# Patient Record
Sex: Female | Born: 1980 | Race: White | Hispanic: Yes | Marital: Single | Smoking: Never smoker
Health system: Southern US, Community
[De-identification: ages and names within clinical notes are randomized; demographics above are authoritative.]

## PROBLEM LIST (undated history)

## (undated) DIAGNOSIS — K219 Gastro-esophageal reflux disease without esophagitis: Secondary | ICD-10-CM

## (undated) DIAGNOSIS — F32A Depression, unspecified: Secondary | ICD-10-CM

## (undated) DIAGNOSIS — N390 Urinary tract infection, site not specified: Secondary | ICD-10-CM

## (undated) DIAGNOSIS — Z8619 Personal history of other infectious and parasitic diseases: Secondary | ICD-10-CM

## (undated) DIAGNOSIS — F329 Major depressive disorder, single episode, unspecified: Secondary | ICD-10-CM

## (undated) HISTORY — DX: Urinary tract infection, site not specified: N39.0

## (undated) HISTORY — DX: Depression, unspecified: F32.A

## (undated) HISTORY — DX: Gastro-esophageal reflux disease without esophagitis: K21.9

## (undated) HISTORY — DX: Major depressive disorder, single episode, unspecified: F32.9

## (undated) HISTORY — DX: Personal history of other infectious and parasitic diseases: Z86.19

---

## 2004-03-25 ENCOUNTER — Inpatient Hospital Stay (HOSPITAL_COMMUNITY): Admission: AD | Admit: 2004-03-25 | Discharge: 2004-03-25 | Payer: Self-pay | Admitting: Obstetrics & Gynecology

## 2004-05-05 ENCOUNTER — Ambulatory Visit (HOSPITAL_COMMUNITY): Admission: RE | Admit: 2004-05-05 | Discharge: 2004-05-05 | Payer: Self-pay | Admitting: *Deleted

## 2004-09-21 ENCOUNTER — Inpatient Hospital Stay (HOSPITAL_COMMUNITY): Admission: AD | Admit: 2004-09-21 | Discharge: 2004-09-21 | Payer: Self-pay | Admitting: *Deleted

## 2004-09-22 ENCOUNTER — Ambulatory Visit: Payer: Self-pay | Admitting: Obstetrics & Gynecology

## 2004-09-22 ENCOUNTER — Inpatient Hospital Stay (HOSPITAL_COMMUNITY): Admission: AD | Admit: 2004-09-22 | Discharge: 2004-09-24 | Payer: Self-pay | Admitting: Obstetrics & Gynecology

## 2005-03-24 IMAGING — US US OB COMP +14 WK
1 series · 13 of 28 positions shown · non-contrast
Comparison: none

CLINICAL DATA: Anatomic exam.  Placental location.

[Series 1: us ob comp +14 wk · 0.27mm/px · 13 of 94 slices shown]
[im 4/94]
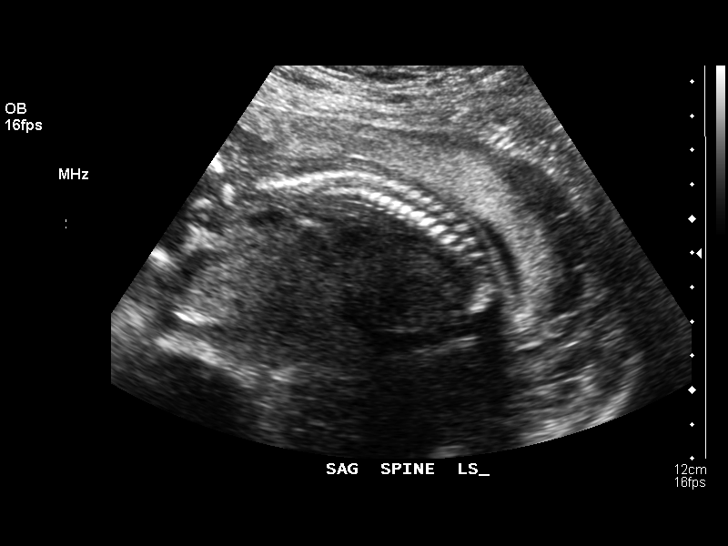
[im 11/94]
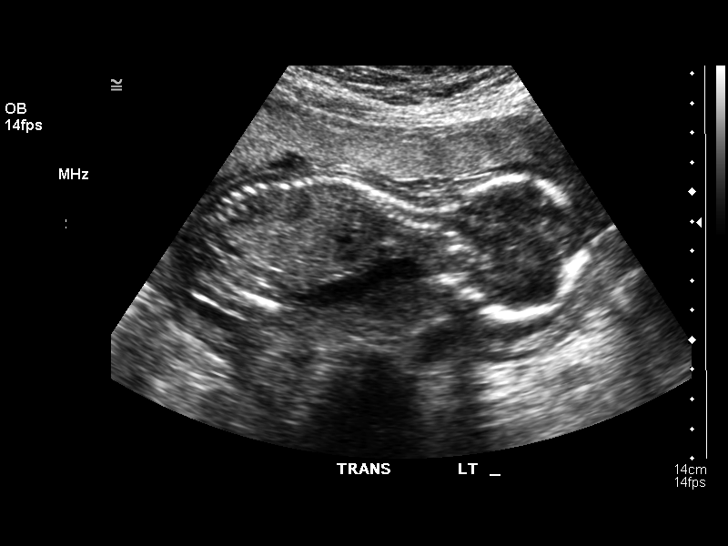
[im 18/94]
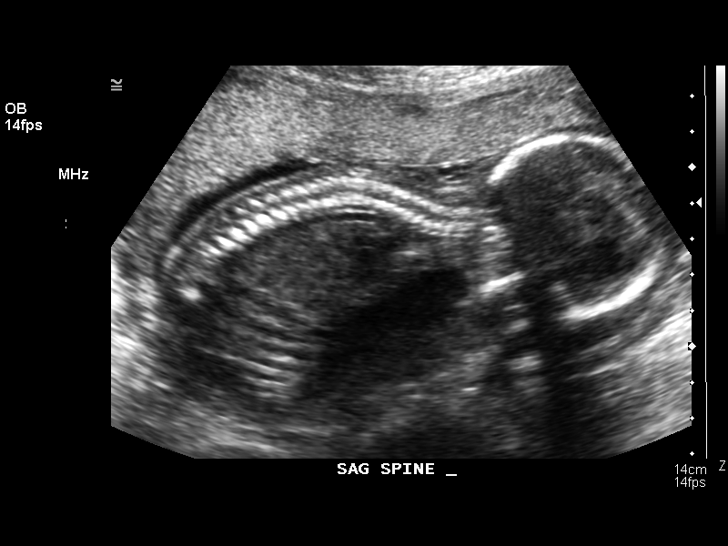
[im 25/94]
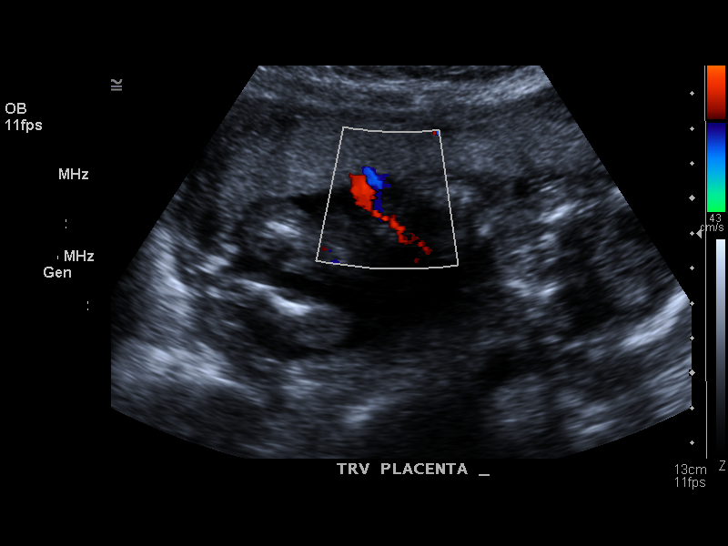
[im 32/94]
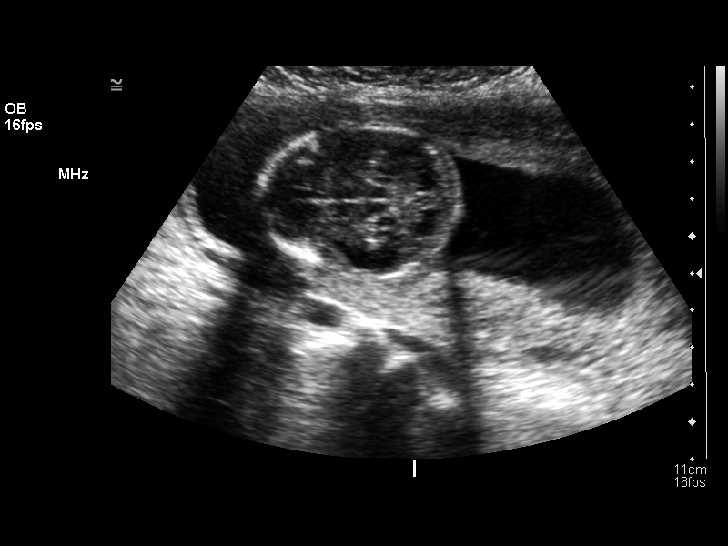
[im 38/94]
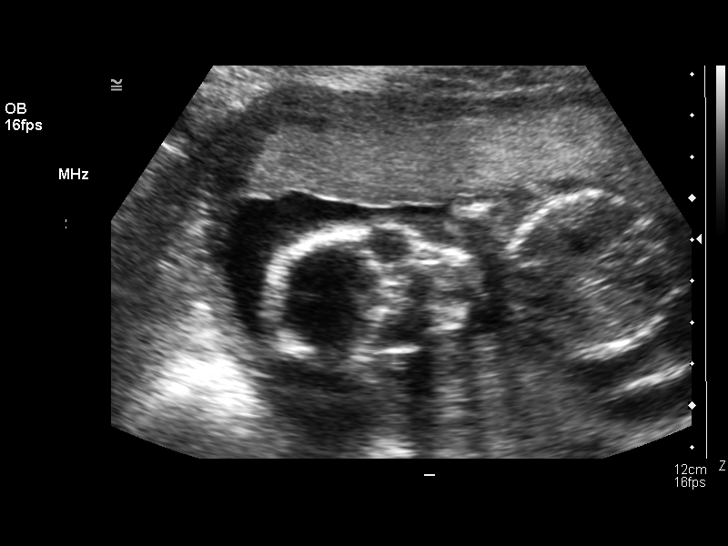
[im 49/94]
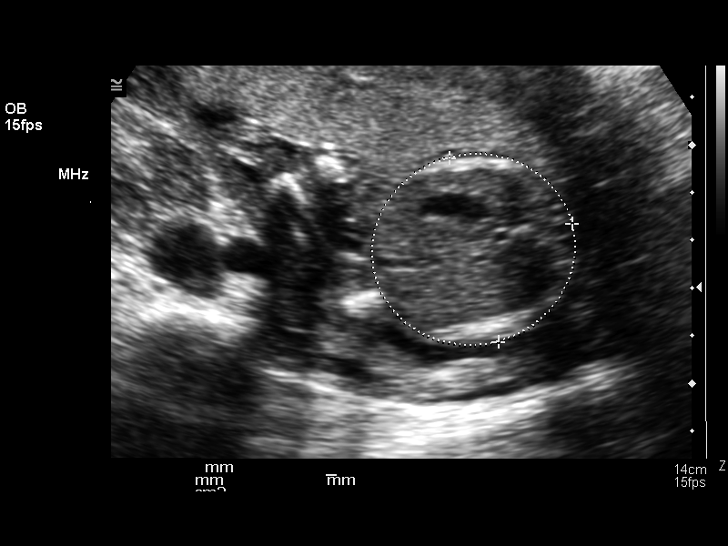
[im 56/94]
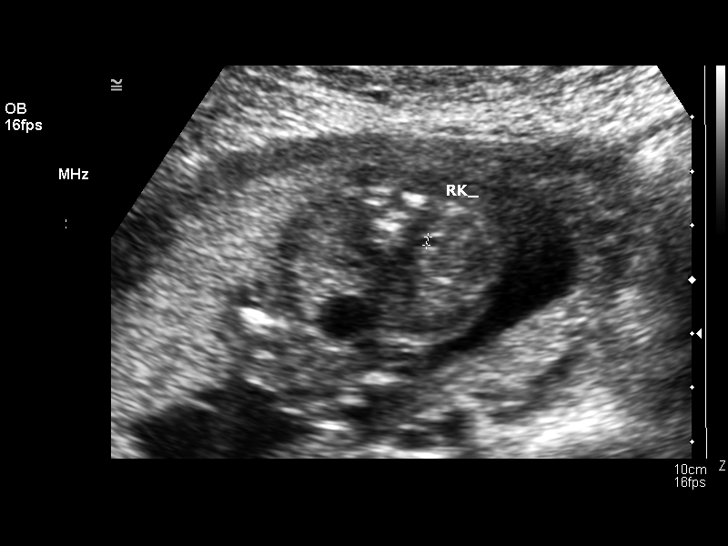
[im 63/94]
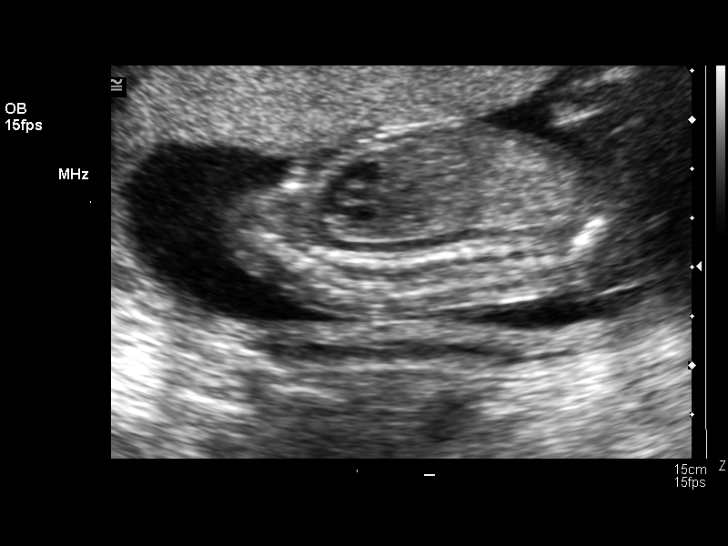
[im 69/94]
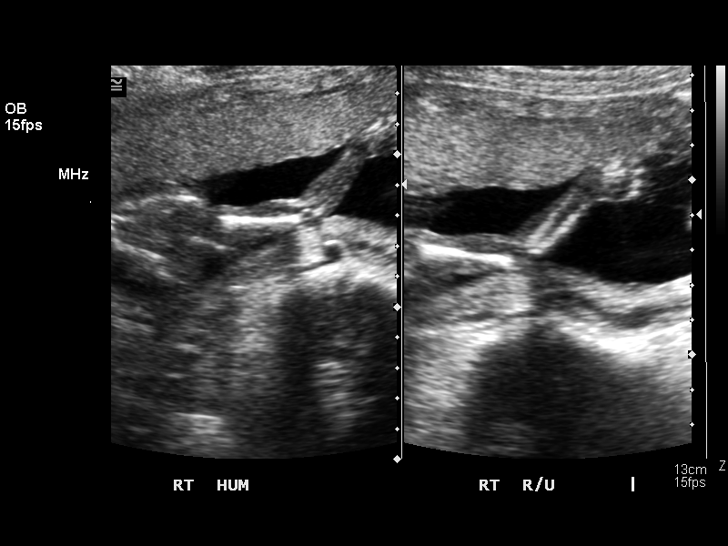
[im 76/94]
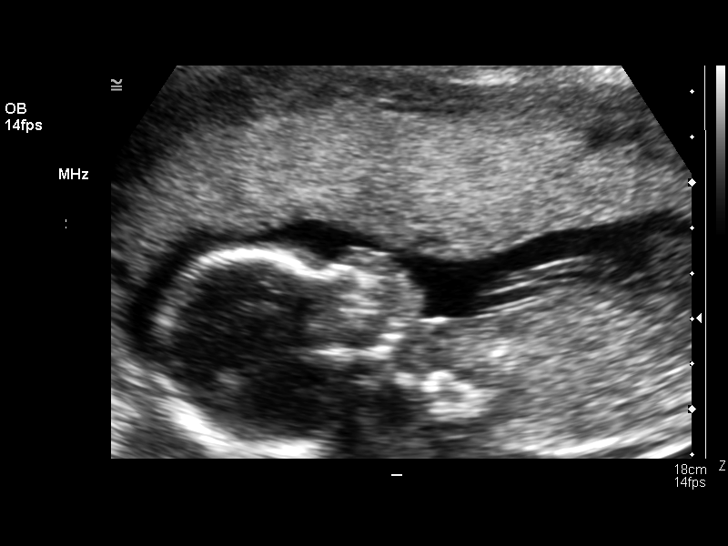
[im 83/94]
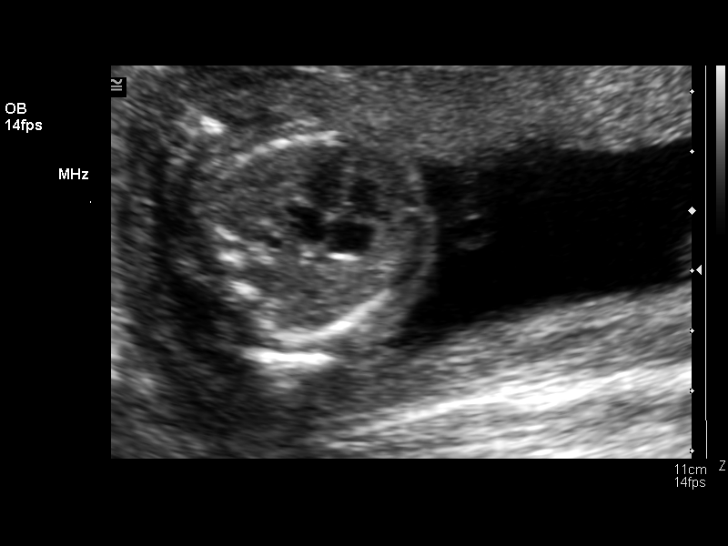
[im 90/94]
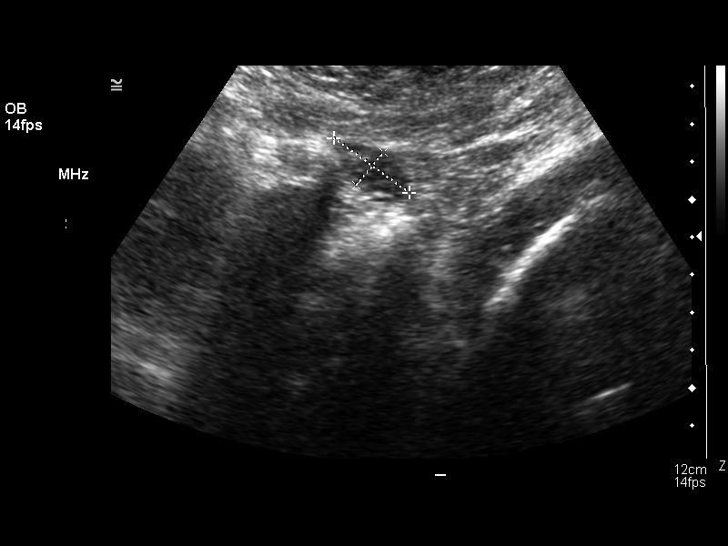

[13 of 28 positions shown; findings below may reference images not displayed]

OBSTETRICAL ULTRASOUND:
Number of Fetuses: 1
Heart Rate: 133 bpm
Movement: yes
Breathing: no       
Presentation: transverse with head to maternal left
Placental Location: anterior
Grade: I
Previa: no
Amniotic Fluid (Subjective): normal
Amniotic Fluid (Objective):   3.3 cm vertical pocket

FETAL BIOMETRY
BPD:  4.2 cm ? 18 w 5 d 
HC: 17.0 cm ? 19 w 2 d  
AC: 13.0 cm ? 18 w 4 d  
FL: 2.8 cm ? 18 w 3 d

MEAN GA:   19 w 2 d

FETAL ANATOMY
Lateral Ventricles: visualized   
Thalami/CSP: visualized     
Posterior Fossa: visualized       
Nuchal Region: N/A  
Spine: visualized     
4 Chamber Heart on Left:  visualized 
Stomach on Left: visualized     
3 Vessel Cord: visualized   
Cord Insertion site: visualized   
Kidneys: visualized   
Bladder: visualized   
Extremities: visualized     

ADDITIONAL ANATOMY VISUALIZED:  LVOT, RVOT, upper lip, orbits, profile, diaphragm, heel, 5th digit, ductal arch, and aortic arch.

MATERNAL UTERINE AND ADNEXAL FINDINGS
Cervix: 4.0 cm transabdominally.
IMPRESSION: 1.  Single intrauterine pregnancy demonstrating an estimated gestational age by ultrasound of 18 weeks 3 days.  Correlation with assigned gestational age of 19 weeks 2 days suggests appropriate growth.
2.  No focal fetal or placental abnormalities are noted with a good anatomic exam possible.
3.  Normal ovaries. 

</u12:p>

## 2005-11-09 ENCOUNTER — Inpatient Hospital Stay (HOSPITAL_COMMUNITY): Admission: AD | Admit: 2005-11-09 | Discharge: 2005-11-09 | Payer: Self-pay | Admitting: Family Medicine

## 2013-03-17 ENCOUNTER — Ambulatory Visit: Payer: Self-pay

## 2013-04-22 ENCOUNTER — Ambulatory Visit: Payer: Self-pay

## 2013-04-22 ENCOUNTER — Ambulatory Visit: Payer: Self-pay | Admitting: Internal Medicine

## 2013-09-22 ENCOUNTER — Other Ambulatory Visit (HOSPITAL_COMMUNITY): Payer: Self-pay | Admitting: Physician Assistant

## 2013-09-22 DIAGNOSIS — Z3689 Encounter for other specified antenatal screening: Secondary | ICD-10-CM

## 2013-09-22 LAB — OB RESULTS CONSOLE ABO/RH: RH Type: POSITIVE

## 2013-09-22 LAB — OB RESULTS CONSOLE HEPATITIS B SURFACE ANTIGEN: Hepatitis B Surface Ag: NEGATIVE

## 2013-09-22 LAB — OB RESULTS CONSOLE RUBELLA ANTIBODY, IGM: Rubella: IMMUNE

## 2013-09-22 LAB — OB RESULTS CONSOLE GC/CHLAMYDIA
Chlamydia: NEGATIVE
Gonorrhea: NEGATIVE

## 2013-09-22 LAB — OB RESULTS CONSOLE HIV ANTIBODY (ROUTINE TESTING): HIV: NONREACTIVE

## 2013-09-22 LAB — OB RESULTS CONSOLE RPR: RPR: NONREACTIVE

## 2013-09-22 LAB — OB RESULTS CONSOLE ANTIBODY SCREEN: Antibody Screen: NEGATIVE

## 2013-09-29 ENCOUNTER — Ambulatory Visit (HOSPITAL_COMMUNITY)
Admission: RE | Admit: 2013-09-29 | Discharge: 2013-09-29 | Disposition: A | Discharge status: Home / Self Care | Payer: Medicaid Other | Source: Ambulatory Visit | Attending: Physician Assistant | Admitting: Physician Assistant

## 2013-09-29 DIAGNOSIS — Z3689 Encounter for other specified antenatal screening: Secondary | ICD-10-CM | POA: Insufficient documentation

## 2013-10-20 ENCOUNTER — Other Ambulatory Visit (HOSPITAL_COMMUNITY): Payer: Self-pay | Admitting: Physician Assistant

## 2013-10-20 DIAGNOSIS — Z1389 Encounter for screening for other disorder: Secondary | ICD-10-CM

## 2013-10-27 ENCOUNTER — Ambulatory Visit (HOSPITAL_COMMUNITY)
Admission: RE | Admit: 2013-10-27 | Discharge: 2013-10-27 | Disposition: A | Discharge status: Home / Self Care | Payer: Self-pay | Source: Ambulatory Visit | Attending: Physician Assistant | Admitting: Physician Assistant

## 2013-10-27 DIAGNOSIS — Z1389 Encounter for screening for other disorder: Secondary | ICD-10-CM | POA: Insufficient documentation

## 2013-10-27 DIAGNOSIS — Z363 Encounter for antenatal screening for malformations: Secondary | ICD-10-CM | POA: Insufficient documentation

## 2014-02-09 LAB — OB RESULTS CONSOLE GBS: GBS: NEGATIVE

## 2014-02-09 LAB — OB RESULTS CONSOLE GC/CHLAMYDIA
Chlamydia: NEGATIVE
Gonorrhea: NEGATIVE

## 2014-03-09 ENCOUNTER — Other Ambulatory Visit (HOSPITAL_COMMUNITY): Payer: Self-pay | Admitting: Urology

## 2014-03-09 DIAGNOSIS — O480 Post-term pregnancy: Secondary | ICD-10-CM

## 2014-03-12 ENCOUNTER — Ambulatory Visit (HOSPITAL_COMMUNITY): Admission: RE | Admit: 2014-03-12 | Payer: Self-pay | Source: Ambulatory Visit

## 2014-03-12 ENCOUNTER — Encounter (HOSPITAL_COMMUNITY): Payer: Self-pay | Admitting: *Deleted

## 2014-03-12 ENCOUNTER — Telehealth (HOSPITAL_COMMUNITY): Payer: Self-pay | Admitting: *Deleted

## 2014-03-12 NOTE — Telephone Encounter (Signed)
Preadmission screen Interpreter number [phone]

## 2014-03-12 NOTE — Telephone Encounter (Signed)
Pt states she missed an ultrasound appt this morning to assess baby placenta and fluid level this morning.  States she contacted Health dept to tell them US not done.  No ucs, leaking of fluid or bleeding,  Moving well.follow up from previous call

## 2014-03-16 ENCOUNTER — Inpatient Hospital Stay (HOSPITAL_COMMUNITY)
Admission: RE | Admit: 2014-03-16 | Discharge: 2014-03-20 | DRG: 766 | Disposition: A | Discharge status: Home / Self Care | Payer: Medicaid Other | Source: Ambulatory Visit | Attending: Family Medicine | Admitting: Family Medicine

## 2014-03-16 ENCOUNTER — Encounter (HOSPITAL_COMMUNITY): Payer: Self-pay

## 2014-03-16 DIAGNOSIS — O328XX0 Maternal care for other malpresentation of fetus, not applicable or unspecified: Secondary | ICD-10-CM | POA: Diagnosis present

## 2014-03-16 DIAGNOSIS — O480 Post-term pregnancy: Secondary | ICD-10-CM | POA: Diagnosis present

## 2014-03-16 DIAGNOSIS — Z833 Family history of diabetes mellitus: Secondary | ICD-10-CM | POA: Diagnosis not present

## 2014-03-16 DIAGNOSIS — Z8249 Family history of ischemic heart disease and other diseases of the circulatory system: Secondary | ICD-10-CM | POA: Diagnosis not present

## 2014-03-16 DIAGNOSIS — Z3A41 41 weeks gestation of pregnancy: Secondary | ICD-10-CM | POA: Diagnosis present

## 2014-03-16 LAB — CBC
HCT: 38 % (ref 36.0–46.0)
Hemoglobin: 13.4 g/dL (ref 12.0–15.0)
MCH: 30.7 pg (ref 26.0–34.0)
MCHC: 35.3 g/dL (ref 30.0–36.0)
MCV: 87 fL (ref 78.0–100.0)
PLATELETS: 197 10*3/uL (ref 150–400)
RBC: 4.37 MIL/uL (ref 3.87–5.11)
RDW: 14.3 % (ref 11.5–15.5)
WBC: 9.2 10*3/uL (ref 4.0–10.5)

## 2014-03-16 MED ORDER — FLEET ENEMA 7-19 GM/118ML RE ENEM
1.0000 | ENEMA | RECTAL | Status: DC | PRN
Start: 2014-03-16 — End: 2014-03-17

## 2014-03-16 MED ORDER — ONDANSETRON HCL 4 MG/2ML IJ SOLN
4.0000 mg | Freq: Four times a day (QID) | INTRAMUSCULAR | Status: DC | PRN
  Administered 2014-03-17: 4 mg via INTRAVENOUS
  Filled 2014-03-16: qty 2

## 2014-03-16 MED ORDER — LIDOCAINE HCL (PF) 1 % IJ SOLN
30.0000 mL | INTRAMUSCULAR | Status: DC | PRN
  Filled 2014-03-16: qty 30

## 2014-03-16 MED ORDER — LACTATED RINGERS IV SOLN
500.0000 mL | INTRAVENOUS | Status: DC | PRN
  Administered 2014-03-17: 300 mL via INTRAVENOUS

## 2014-03-16 MED ORDER — OXYCODONE-ACETAMINOPHEN 5-325 MG PO TABS: 2.0000 | ORAL_TABLET | ORAL | Status: DC | PRN

## 2014-03-16 MED ORDER — OXYTOCIN BOLUS FROM INFUSION
500.0000 mL | INTRAVENOUS | Status: DC
Start: 2014-03-16 — End: 2014-03-17

## 2014-03-16 MED ORDER — CITRIC ACID-SODIUM CITRATE 334-500 MG/5ML PO SOLN
30.0000 mL | ORAL | Status: DC | PRN
  Administered 2014-03-17: 30 mL via ORAL
  Filled 2014-03-16: qty 15

## 2014-03-16 MED ORDER — OXYTOCIN 40 UNITS IN LACTATED RINGERS INFUSION - SIMPLE MED
1.0000 m[IU]/min | INTRAVENOUS | Status: DC
  Administered 2014-03-16: 2 m[IU]/min via INTRAVENOUS
  Filled 2014-03-16: qty 1000

## 2014-03-16 MED ORDER — LACTATED RINGERS IV SOLN
INTRAVENOUS | Status: DC
  Administered 2014-03-16 – 2014-03-17 (×8): via INTRAVENOUS

## 2014-03-16 MED ORDER — TERBUTALINE SULFATE 1 MG/ML IJ SOLN: 0.2500 mg | Freq: Once | INTRAMUSCULAR | Status: AC | PRN

## 2014-03-16 MED ORDER — OXYCODONE-ACETAMINOPHEN 5-325 MG PO TABS: 1.0000 | ORAL_TABLET | ORAL | Status: DC | PRN

## 2014-03-16 MED ORDER — ACETAMINOPHEN 325 MG PO TABS
650.0000 mg | ORAL_TABLET | ORAL | Status: DC | PRN
Start: 2014-03-16 — End: 2014-03-17

## 2014-03-16 MED ORDER — OXYTOCIN 40 UNITS IN LACTATED RINGERS INFUSION - SIMPLE MED: 62.5000 mL/h | INTRAVENOUS | Status: DC

## 2014-03-16 NOTE — H&P (Cosign Needed)
[name] LairMaria [name] is a 33 y.o. [phone] 3741w3d female presenting for induction of labor for post-dates.  Pt has an uncomplicated pregnancy and is GBS negative.  Pt had a reactive NST on Monday and Thursday of last week after she expressed concerns that she thought her baby was moving less.  Pt states since last week, she has felt fetal movement has returned to normal.  Pt denies any bleeding, fluid leak, loss of mucus plug, or any other s/sx.    Maternal Medical History:  Reason for admission: Nausea.  Fetal activity: Perceived fetal activity is normal.   Last perceived fetal movement was within the past hour.    Prenatal complications: No bleeding, cholelithiasis, HIV, PIH, infection, IUGR, nephrolithiasis, oligohydramnios, placental abnormality, polyhydramnios, pre-eclampsia, preterm labor, substance abuse, thrombocytopenia or thrombophilia.   Prenatal Complications - Diabetes: none.    OB History    Gravida Para Term Preterm AB TAB SAB Ectopic Multiple Living   3 2 2       2      Past Medical History  Diagnosis Date  . Depression     pp depression  . Frequent UTI   . GERD (gastroesophageal reflux disease)   . History of genital warts    History reviewed. No pertinent past surgical history. Family History: family history includes Diabetes in her mother; Heart murmur in her son; Hypertension in her mother; Urinary tract infection in her mother and sister. Social History:  reports that she has never smoked. She has never used smokeless tobacco. She reports that she does not drink alcohol or use illicit drugs.   Prenatal Transfer Tool  Maternal Diabetes: No Genetic Screening: Normal Maternal Ultrasounds/Referrals: Normal Fetal Ultrasounds or other Referrals:  None Maternal Substance Abuse:  No Significant Maternal Medications:  None Significant Maternal Lab Results:  None Other Comments:  None  Review of Systems  Constitutional: Negative for fever and chills.  Eyes: Negative for  blurred vision.  Respiratory: Negative for cough and shortness of breath.   Cardiovascular: Negative for chest pain.  Gastrointestinal: Negative for nausea and vomiting.  Genitourinary: Negative.   Musculoskeletal: Negative.   Skin: Negative.   Neurological: Negative.  Negative for headaches.  Endo/Heme/Allergies: Negative.   Psychiatric/Behavioral: Negative.       Blood pressure 118/65, pulse 72, temperature 98.1 F (36.7 C), temperature source Oral, resp. rate 18, height 5' (1.524 m), weight 88.451 kg (195 lb), last menstrual period 05/07/2013. Maternal Exam:  Abdomen: Patient reports no abdominal tenderness. Fetal presentation: vertex  Introitus: Normal vulva. Normal vagina.  Ferning test: not done.  Nitrazine test: not done. Amniotic fluid character: not assessed.  Pelvis: adequate for delivery.   Cervix: Cervix evaluated by digital exam.     Physical Exam  Constitutional: She is oriented to person, place, and time. She appears well-developed and well-nourished.  HENT:  Head: Normocephalic and atraumatic.  Eyes: Conjunctivae and EOM are normal. Pupils are equal, round, and reactive to light.  Neck: Normal range of motion.  Cardiovascular: Normal rate and regular rhythm.   Genitourinary: Vagina normal and uterus normal.  Musculoskeletal: Normal range of motion.  Neurological: She is alert and oriented to person, place, and time. She has normal reflexes.  Skin: Skin is warm and dry.  Psychiatric: She has a normal mood and affect. Her behavior is normal.    Prenatal labs: ABO, Rh: O/Positive/-- (06/29 0000) Antibody: Negative (06/29 0000) Rubella: Immune (06/29 0000) RPR: Nonreactive (06/29 0000)  HBsAg: Negative (06/29 0000)  HIV: Non-reactive (06/29  0000)  GBS: Negative (11/16 0000)   Assessment/Plan: Pt is a 3457w3d G3P2002 who presents for induction of labor.  Pt's cervix is favorable and 3cm, will begin induction with Pitocin, if no progress, will proceed with  AROM.  Anticipate a NSVD.     [name] [name], [name] L, MD 03/16/2014, 8:48 PM   OB fellow attestation I have seen and examined this patient and agree with above documentation in the resident's note.   [name] LairMaria [name] is a 33 y.o. [id number] admitted for post dates induction. +Fetal movement. Rare contractions. Denies loss of fluid, vaginal bleeding.   PE:  BP 106/62 mmHg  Pulse 69  Temp(Src) 97.9 F (36.6 C) (Oral)  Resp 18  Ht 5' (1.524 m)  Wt 195 lb (88.451 kg)  BMI 38.08 kg/m2  LMP 05/07/2013 Fundus firm  Plan:  Admit to labor and delivery for induction of labor.  1. PDIOL. Pitocin.  2. FWB: Category I 3. GBS negative  [name] [name], [name] Elsasser, MD 10:11 PM

## 2014-03-17 ENCOUNTER — Encounter (HOSPITAL_COMMUNITY)
Admission: RE | Disposition: A | Discharge status: Home / Self Care | Payer: Self-pay | Source: Ambulatory Visit | Attending: Family Medicine

## 2014-03-17 ENCOUNTER — Inpatient Hospital Stay (HOSPITAL_COMMUNITY): Payer: Medicaid Other | Admitting: Anesthesiology

## 2014-03-17 ENCOUNTER — Encounter (HOSPITAL_COMMUNITY): Payer: Self-pay

## 2014-03-17 LAB — CBC
HEMATOCRIT: 26.8 % — AB (ref 36.0–46.0)
Hemoglobin: 9.4 g/dL — ABNORMAL LOW (ref 12.0–15.0)
MCH: 30.6 pg (ref 26.0–34.0)
MCHC: 35.1 g/dL (ref 30.0–36.0)
MCV: 87.3 fL (ref 78.0–100.0)
Platelets: 171 10*3/uL (ref 150–400)
RBC: 3.07 MIL/uL — AB (ref 3.87–5.11)
RDW: 14.5 % (ref 11.5–15.5)
WBC: 17.9 10*3/uL — AB (ref 4.0–10.5)

## 2014-03-17 LAB — TYPE AND SCREEN
ABO/RH(D): O POS
Antibody Screen: NEGATIVE

## 2014-03-17 LAB — ABO/RH: ABO/RH(D): O POS

## 2014-03-17 SURGERY — Surgical Case
Anesthesia: Spinal

## 2014-03-17 MED ORDER — TETANUS-DIPHTH-ACELL PERTUSSIS 5-2.5-18.5 LF-MCG/0.5 IM SUSP: 0.5000 mL | Freq: Once | INTRAMUSCULAR | Status: DC

## 2014-03-17 MED ORDER — BUPIVACAINE HCL (PF) 0.25 % IJ SOLN
INTRAMUSCULAR | Status: AC
  Filled 2014-03-17: qty 30

## 2014-03-17 MED ORDER — ONDANSETRON HCL 4 MG/2ML IJ SOLN
INTRAMUSCULAR | Status: DC | PRN
  Administered 2014-03-17: 4 mg via INTRAVENOUS

## 2014-03-17 MED ORDER — WITCH HAZEL-GLYCERIN EX PADS: 1.0000 "application " | MEDICATED_PAD | CUTANEOUS | Status: DC | PRN

## 2014-03-17 MED ORDER — OXYCODONE-ACETAMINOPHEN 5-325 MG PO TABS: 2.0000 | ORAL_TABLET | ORAL | Status: DC | PRN

## 2014-03-17 MED ORDER — MORPHINE SULFATE (PF) 0.5 MG/ML IJ SOLN
INTRAMUSCULAR | Status: DC | PRN
  Administered 2014-03-17: .2 mg via EPIDURAL

## 2014-03-17 MED ORDER — DIPHENHYDRAMINE HCL 25 MG PO CAPS: 25.0000 mg | ORAL_CAPSULE | Freq: Four times a day (QID) | ORAL | Status: DC | PRN

## 2014-03-17 MED ORDER — OXYTOCIN 10 UNIT/ML IJ SOLN
INTRAMUSCULAR | Status: AC
  Filled 2014-03-17: qty 4

## 2014-03-17 MED ORDER — NALBUPHINE HCL 10 MG/ML IJ SOLN: 5.0000 mg | INTRAMUSCULAR | Status: DC | PRN

## 2014-03-17 MED ORDER — MEPERIDINE HCL 25 MG/ML IJ SOLN
INTRAMUSCULAR | Status: DC | PRN
  Administered 2014-03-17: 12.5 mg via INTRAVENOUS

## 2014-03-17 MED ORDER — DIPHENHYDRAMINE HCL 25 MG PO CAPS: 25.0000 mg | ORAL_CAPSULE | ORAL | Status: DC | PRN

## 2014-03-17 MED ORDER — CEFAZOLIN SODIUM-DEXTROSE 2-3 GM-% IV SOLR
INTRAVENOUS | Status: AC
  Filled 2014-03-17: qty 50

## 2014-03-17 MED ORDER — PHENYLEPHRINE HCL 10 MG/ML IJ SOLN
INTRAMUSCULAR | Status: DC | PRN
  Administered 2014-03-17 (×5): 40 ug via INTRAVENOUS

## 2014-03-17 MED ORDER — MEPERIDINE HCL 25 MG/ML IJ SOLN: 6.2500 mg | INTRAMUSCULAR | Status: DC | PRN

## 2014-03-17 MED ORDER — PRENATAL MULTIVITAMIN CH
1.0000 | ORAL_TABLET | Freq: Every day | ORAL | Status: DC
  Administered 2014-03-18 – 2014-03-19 (×2): 1 via ORAL
  Filled 2014-03-17 (×3): qty 1

## 2014-03-17 MED ORDER — OXYTOCIN 40 UNITS IN LACTATED RINGERS INFUSION - SIMPLE MED: 62.5000 mL/h | INTRAVENOUS | Status: DC

## 2014-03-17 MED ORDER — SENNOSIDES-DOCUSATE SODIUM 8.6-50 MG PO TABS
2.0000 | ORAL_TABLET | ORAL | Status: DC
  Administered 2014-03-17 – 2014-03-20 (×3): 2 via ORAL
  Filled 2014-03-17 (×3): qty 2

## 2014-03-17 MED ORDER — EPHEDRINE 5 MG/ML INJ
INTRAVENOUS | Status: AC
  Filled 2014-03-17: qty 10

## 2014-03-17 MED ORDER — FENTANYL CITRATE 0.05 MG/ML IJ SOLN
100.0000 ug | INTRAMUSCULAR | Status: DC | PRN
  Administered 2014-03-17 (×2): 100 ug via INTRAVENOUS
  Filled 2014-03-17 (×2): qty 2

## 2014-03-17 MED ORDER — ONDANSETRON HCL 4 MG PO TABS: 4.0000 mg | ORAL_TABLET | ORAL | Status: DC | PRN

## 2014-03-17 MED ORDER — MEPERIDINE HCL 25 MG/ML IJ SOLN
INTRAMUSCULAR | Status: AC
  Filled 2014-03-17: qty 1

## 2014-03-17 MED ORDER — NALBUPHINE HCL 10 MG/ML IJ SOLN: 5.0000 mg | Freq: Once | INTRAMUSCULAR | Status: AC | PRN

## 2014-03-17 MED ORDER — SODIUM CHLORIDE 0.9 % IJ SOLN
3.0000 mL | INTRAMUSCULAR | Status: DC | PRN
  Administered 2014-03-18: 3 mL via INTRAVENOUS
  Filled 2014-03-17: qty 3

## 2014-03-17 MED ORDER — PROMETHAZINE HCL 25 MG/ML IJ SOLN
6.2500 mg | INTRAMUSCULAR | Status: DC | PRN
Start: 2014-03-17 — End: 2014-03-17

## 2014-03-17 MED ORDER — KETOROLAC TROMETHAMINE 30 MG/ML IJ SOLN: 30.0000 mg | Freq: Four times a day (QID) | INTRAMUSCULAR | Status: AC | PRN

## 2014-03-17 MED ORDER — NALOXONE HCL 0.4 MG/ML IJ SOLN: 0.4000 mg | INTRAMUSCULAR | Status: DC | PRN

## 2014-03-17 MED ORDER — PRENATAL MULTIVITAMIN CH: 1.0000 | ORAL_TABLET | Freq: Every day | ORAL | Status: DC

## 2014-03-17 MED ORDER — FENTANYL CITRATE 0.05 MG/ML IJ SOLN
INTRAMUSCULAR | Status: DC | PRN
  Administered 2014-03-17: 10 ug via INTRAVENOUS

## 2014-03-17 MED ORDER — LANOLIN HYDROUS EX OINT: TOPICAL_OINTMENT | CUTANEOUS | Status: DC | PRN

## 2014-03-17 MED ORDER — LACTATED RINGERS IV SOLN
INTRAVENOUS | Status: DC | PRN
  Administered 2014-03-17 (×2): via INTRAVENOUS

## 2014-03-17 MED ORDER — NALOXONE HCL 1 MG/ML IJ SOLN: 1.0000 ug/kg/h | INTRAVENOUS | Status: DC | PRN

## 2014-03-17 MED ORDER — ZOLPIDEM TARTRATE 5 MG PO TABS: 5.0000 mg | ORAL_TABLET | Freq: Every evening | ORAL | Status: DC | PRN

## 2014-03-17 MED ORDER — SCOPOLAMINE 1 MG/3DAYS TD PT72
MEDICATED_PATCH | TRANSDERMAL | Status: AC
  Filled 2014-03-17: qty 1

## 2014-03-17 MED ORDER — PHENYLEPHRINE HCL 10 MG/ML IJ SOLN
INTRAMUSCULAR | Status: AC
  Filled 2014-03-17: qty 1

## 2014-03-17 MED ORDER — OXYTOCIN 40 UNITS IN LACTATED RINGERS INFUSION - SIMPLE MED
INTRAVENOUS | Status: AC
  Filled 2014-03-17: qty 1000

## 2014-03-17 MED ORDER — SIMETHICONE 80 MG PO CHEW: 80.0000 mg | CHEWABLE_TABLET | ORAL | Status: DC | PRN

## 2014-03-17 MED ORDER — DIBUCAINE 1 % RE OINT: 1.0000 "application " | TOPICAL_OINTMENT | RECTAL | Status: DC | PRN

## 2014-03-17 MED ORDER — HYDROMORPHONE HCL 1 MG/ML IJ SOLN: 0.2500 mg | INTRAMUSCULAR | Status: DC | PRN

## 2014-03-17 MED ORDER — OXYCODONE-ACETAMINOPHEN 5-325 MG PO TABS: 1.0000 | ORAL_TABLET | ORAL | Status: DC | PRN

## 2014-03-17 MED ORDER — LANOLIN HYDROUS EX OINT: 1.0000 "application " | TOPICAL_OINTMENT | CUTANEOUS | Status: DC | PRN

## 2014-03-17 MED ORDER — MORPHINE SULFATE 0.5 MG/ML IJ SOLN
INTRAMUSCULAR | Status: AC
  Filled 2014-03-17: qty 10

## 2014-03-17 MED ORDER — SCOPOLAMINE 1 MG/3DAYS TD PT72
1.0000 | MEDICATED_PATCH | Freq: Once | TRANSDERMAL | Status: DC
  Filled 2014-03-17: qty 1

## 2014-03-17 MED ORDER — ONDANSETRON HCL 4 MG/2ML IJ SOLN: 4.0000 mg | Freq: Three times a day (TID) | INTRAMUSCULAR | Status: DC | PRN

## 2014-03-17 MED ORDER — SENNOSIDES-DOCUSATE SODIUM 8.6-50 MG PO TABS: 2.0000 | ORAL_TABLET | ORAL | Status: DC

## 2014-03-17 MED ORDER — ONDANSETRON HCL 4 MG/2ML IJ SOLN
INTRAMUSCULAR | Status: AC
  Filled 2014-03-17: qty 2

## 2014-03-17 MED ORDER — SCOPOLAMINE 1 MG/3DAYS TD PT72
MEDICATED_PATCH | TRANSDERMAL | Status: DC | PRN
  Administered 2014-03-17: 1 via TRANSDERMAL

## 2014-03-17 MED ORDER — LACTATED RINGERS IV SOLN
INTRAVENOUS | Status: DC
  Administered 2014-03-17 (×2): via INTRAVENOUS

## 2014-03-17 MED ORDER — MENTHOL 3 MG MT LOZG: 1.0000 | LOZENGE | OROMUCOSAL | Status: DC | PRN

## 2014-03-17 MED ORDER — IBUPROFEN 600 MG PO TABS: 600.0000 mg | ORAL_TABLET | Freq: Four times a day (QID) | ORAL | Status: DC

## 2014-03-17 MED ORDER — ONDANSETRON HCL 4 MG/2ML IJ SOLN: 4.0000 mg | INTRAMUSCULAR | Status: DC | PRN

## 2014-03-17 MED ORDER — BUPIVACAINE HCL (PF) 0.25 % IJ SOLN
INTRAMUSCULAR | Status: DC | PRN
  Administered 2014-03-17: 30 mL

## 2014-03-17 MED ORDER — CEFAZOLIN SODIUM-DEXTROSE 2-3 GM-% IV SOLR
INTRAVENOUS | Status: DC | PRN
  Administered 2014-03-17: 2 g via INTRAVENOUS

## 2014-03-17 MED ORDER — IBUPROFEN 600 MG PO TABS
600.0000 mg | ORAL_TABLET | Freq: Four times a day (QID) | ORAL | Status: DC
  Administered 2014-03-17 – 2014-03-20 (×11): 600 mg via ORAL
  Filled 2014-03-17 (×12): qty 1

## 2014-03-17 MED ORDER — OXYCODONE-ACETAMINOPHEN 5-325 MG PO TABS
1.0000 | ORAL_TABLET | ORAL | Status: DC | PRN
  Administered 2014-03-18: 1 via ORAL
  Filled 2014-03-17: qty 1

## 2014-03-17 MED ORDER — DIPHENHYDRAMINE HCL 50 MG/ML IJ SOLN: 12.5000 mg | INTRAMUSCULAR | Status: DC | PRN

## 2014-03-17 MED ORDER — FENTANYL CITRATE 0.05 MG/ML IJ SOLN
INTRAMUSCULAR | Status: AC
  Filled 2014-03-17: qty 2

## 2014-03-17 MED ORDER — KETOROLAC TROMETHAMINE 30 MG/ML IJ SOLN: 30.0000 mg | Freq: Four times a day (QID) | INTRAMUSCULAR | Status: DC | PRN

## 2014-03-17 MED ORDER — SIMETHICONE 80 MG PO CHEW
80.0000 mg | CHEWABLE_TABLET | Freq: Three times a day (TID) | ORAL | Status: DC
  Administered 2014-03-17 – 2014-03-19 (×7): 80 mg via ORAL
  Filled 2014-03-17 (×7): qty 1

## 2014-03-17 MED ORDER — SIMETHICONE 80 MG PO CHEW
80.0000 mg | CHEWABLE_TABLET | ORAL | Status: DC
  Administered 2014-03-17 – 2014-03-20 (×3): 80 mg via ORAL
  Filled 2014-03-17 (×3): qty 1

## 2014-03-17 MED ORDER — IBUPROFEN 600 MG PO TABS: 600.0000 mg | ORAL_TABLET | Freq: Four times a day (QID) | ORAL | Status: DC | PRN

## 2014-03-17 MED ORDER — BENZOCAINE-MENTHOL 20-0.5 % EX AERO: 1.0000 "application " | INHALATION_SPRAY | CUTANEOUS | Status: DC | PRN

## 2014-03-17 MED ORDER — LACTATED RINGERS IV SOLN
40.0000 [IU] | INTRAVENOUS | Status: DC | PRN
  Administered 2014-03-17: 40 [IU] via INTRAVENOUS

## 2014-03-17 MED ORDER — PHENYLEPHRINE 8 MG IN D5W 100 ML (0.08MG/ML) PREMIX OPTIME
INJECTION | INTRAVENOUS | Status: DC | PRN
  Administered 2014-03-17: 60 ug/min via INTRAVENOUS

## 2014-03-17 MED ORDER — BUPIVACAINE IN DEXTROSE 0.75-8.25 % IT SOLN
INTRATHECAL | Status: DC | PRN
  Administered 2014-03-17: 1.4 mL via INTRATHECAL

## 2014-03-17 SURGICAL SUPPLY — 38 items
APL SKNCLS STERI-STRIP NONHPOA (GAUZE/BANDAGES/DRESSINGS) ×1
BENZOIN TINCTURE PRP APPL 2/3 (GAUZE/BANDAGES/DRESSINGS) ×3 IMPLANT
CATH ROBINSON RED A/P 16FR (CATHETERS) IMPLANT
CLAMP CORD UMBIL (MISCELLANEOUS) IMPLANT
CLOSURE WOUND 1/2 X4 (GAUZE/BANDAGES/DRESSINGS) ×1
CLOTH BEACON ORANGE TIMEOUT ST (SAFETY) ×3 IMPLANT
DRAPE SHEET LG 3/4 BI-LAMINATE (DRAPES) IMPLANT
DRSG OPSITE POSTOP 4X10 (GAUZE/BANDAGES/DRESSINGS) ×3 IMPLANT
DURAPREP 26ML APPLICATOR (WOUND CARE) ×3 IMPLANT
ELECT REM PT RETURN 9FT ADLT (ELECTROSURGICAL) ×3
ELECTRODE REM PT RTRN 9FT ADLT (ELECTROSURGICAL) ×1 IMPLANT
EXTRACTOR VACUUM M CUP 4 TUBE (SUCTIONS) IMPLANT
EXTRACTOR VACUUM M CUP 4' TUBE (SUCTIONS)
GLOVE BIOGEL PI IND STRL 7.5 (GLOVE) ×2 IMPLANT
GLOVE BIOGEL PI INDICATOR 7.5 (GLOVE) ×4
GLOVE ECLIPSE 7.5 STRL STRAW (GLOVE) ×3 IMPLANT
GOWN STRL REUS W/TWL LRG LVL3 (GOWN DISPOSABLE) ×9 IMPLANT
KIT ABG SYR 3ML LUER SLIP (SYRINGE) IMPLANT
NDL HYPO 25X5/8 SAFETYGLIDE (NEEDLE) IMPLANT
NEEDLE HYPO 22GX1.5 SAFETY (NEEDLE) ×3 IMPLANT
NEEDLE HYPO 25X5/8 SAFETYGLIDE (NEEDLE) IMPLANT
NS IRRIG 1000ML POUR BTL (IV SOLUTION) ×3 IMPLANT
PACK C SECTION WH (CUSTOM PROCEDURE TRAY) ×3 IMPLANT
PAD OB MATERNITY 4.3X12.25 (PERSONAL CARE ITEMS) ×3 IMPLANT
RTRCTR C-SECT PINK 25CM LRG (MISCELLANEOUS) ×2 IMPLANT
STRIP CLOSURE SKIN 1/2X4 (GAUZE/BANDAGES/DRESSINGS) ×2 IMPLANT
SUT MNCRL 0 VIOLET CTX 36 (SUTURE) IMPLANT
SUT MONOCRYL 0 CTX 36 (SUTURE)
SUT PLAIN 2 0 XLH (SUTURE) ×2 IMPLANT
SUT VIC AB 0 CTX 36 (SUTURE) ×18
SUT VIC AB 0 CTX36XBRD ANBCTRL (SUTURE) ×3 IMPLANT
SUT VIC AB 2-0 CT1 27 (SUTURE) ×3
SUT VIC AB 2-0 CT1 TAPERPNT 27 (SUTURE) ×1 IMPLANT
SUT VIC AB 4-0 KS 27 (SUTURE) ×3 IMPLANT
SYR 30ML LL (SYRINGE) ×3 IMPLANT
TOWEL OR 17X24 6PK STRL BLUE (TOWEL DISPOSABLE) ×3 IMPLANT
TRAY FOLEY CATH 14FR (SET/KITS/TRAYS/PACK) ×3 IMPLANT
WATER STERILE IRR 1000ML POUR (IV SOLUTION) ×3 IMPLANT

## 2014-03-17 NOTE — OR Nursing (Signed)
Per Surgeon placenta to L&D

## 2014-03-17 NOTE — Progress Notes (Signed)
I assisted Lillia AbedLindsay, RN with questions.  Eda H Royal  Interpreter.

## 2014-03-17 NOTE — Anesthesia Procedure Notes (Signed)
Spinal Patient location during procedure: OB Start time: 03/17/2014 10:30 AM End time: 03/17/2014 10:35 AM Staffing Anesthesiologist: Milana Obey Performed by: anesthesiologist  Preanesthetic Checklist Completed: patient identified, site marked, surgical consent, pre-op evaluation, timeout performed, IV checked, risks and benefits discussed and monitors and equipment checked Spinal Block Patient position: sitting Prep: Betadine Patient monitoring: heart rate, continuous pulse ox and blood pressure Approach: midline Location: L3-4 Injection technique: single-shot Needle Needle type: Sprotte  Needle gauge: 24 G Needle length: 9 cm Assessment Sensory level: T4 Additional Notes Expiration date of kit checked and confirmed. Patient tolerated procedure well, without complications.

## 2014-03-17 NOTE — Transfer of Care (Signed)
Immediate Anesthesia Transfer of Care Note  Patient: Tim LairMaria Sanchez  Procedure(s) Performed: Procedure(s): CESAREAN SECTION (N/A)  Patient Location: PACU  Anesthesia Type:Spinal  Level of Consciousness: awake, alert , oriented and patient cooperative  Airway & Oxygen Therapy: Patient Spontanous Breathing  Post-op Assessment: Report given to PACU RN and Post -op Vital signs reviewed and stable  Post vital signs: Reviewed and stable  Complications: No apparent anesthesia complications

## 2014-03-17 NOTE — Progress Notes (Signed)
Patient ID: Tim LairMaria Sanchez, female   DOB: 03/20/1981, 33 y.o.   MRN: [id number]  Patient pushing for 2.5 hours with little progress.  Still 10/100/0.  Having caput and variables with good return.  The risks of cesarean section discussed with the patient included but were not limited to: bleeding which may require transfusion or reoperation; infection which may require antibiotics; injury to bowel, bladder, ureters or other surrounding organs; injury to the fetus; need for additional procedures including hysterectomy in the event of a life-threatening hemorrhage; placental abnormalities wth subsequent pregnancies, incisional problems, thromboembolic phenomenon and other postoperative/anesthesia complications. The patient concurred with the proposed plan, giving informed written consent for the procedure.   Patient has been NPO since last night she will remain NPO for procedure. Anesthesia and OR aware.  Preoperative prophylactic Ancef ordered on call to the OR.  To OR when ready.

## 2014-03-17 NOTE — Plan of Care (Signed)
Problem: Phase II Progression Outcomes Goal: Other Phase II Outcomes/Goals Outcome: Completed/Met Date Met:  03/17/14 Interpreter available for all assessments, explanations, and discussions of plan of care.  Pt also understood that if she had questions, the interpreter was available.

## 2014-03-17 NOTE — Op Note (Signed)
[name] [name] PROCEDURE DATE: 03/16/2014 - 03/17/2014  PREOPERATIVE DIAGNOSIS: Intrauterine pregnancy at  10050w4d weeks gestation; failure to progress: arrest of descent and malpresentation: occiput posterior  POSTOPERATIVE DIAGNOSIS: The same  PROCEDURE: Primary Low Transverse Cesarean Section  SURGEON:  Dr. Candelaria CelesteJacob Chas Axel  ASSISTANT: Dr Jolayne Pantheronstant  INDICATIONS: [name] [name] is a 33 y.o. G3P3003 at 8550w4d scheduled for cesarean section secondary to failure to progress: arrest of descent and malpresentation: Occiput posterior.  The risks of cesarean section discussed with the patient included but were not limited to: bleeding which may require transfusion or reoperation; infection which may require antibiotics; injury to bowel, bladder, ureters or other surrounding organs; injury to the fetus; need for additional procedures including hysterectomy in the event of a life-threatening hemorrhage; placental abnormalities wth subsequent pregnancies, incisional problems, thromboembolic phenomenon and other postoperative/anesthesia complications. The patient concurred with the proposed plan, giving informed written consent for the procedure.    FINDINGS:  Viable female infant in cephalic presentation.  Apgars 9 and 9, weight pending.  Clear amniotic fluid.  Intact placenta, three vessel cord.  Normal uterus, fallopian tubes and ovaries bilaterally.  ANESTHESIA:    Spinal INTRAVENOUS FLUIDS:5100 ml ESTIMATED BLOOD LOSS: 1500 ml URINE OUTPUT:  200 ml SPECIMENS: Placenta sent to pathology COMPLICATIONS: None immediate  PROCEDURE IN DETAIL:  The patient received intravenous antibiotics and had sequential compression devices applied to her lower extremities while in the preoperative area.  She was then taken to the operating room where spinal anesthesia was administered and was found to be adequate. She was then placed in a dorsal supine position with a leftward tilt, and prepped and draped in a sterile  manner.  A foley catheter was placed into her bladder and attached to constant gravity, which drained clear fluid throughout.  After an adequate timeout was performed, a Pfannenstiel skin incision was made with scalpel and carried through to the underlying layer of fascia. The fascia was incised in the midline and this incision was extended bilaterally using the Mayo scissors. Kocher clamps were applied to the superior aspect of the fascial incision and the underlying rectus muscles were dissected off bluntly. A similar process was carried out on the inferior aspect of the facial incision. The rectus muscles were separated in the midline bluntly and the peritoneum was entered bluntly. An Alexis retractor was placed to aid in visualization of the uterus.  Attention was turned to the lower uterine segment where a transverse hysterotomy was made with a scalpel and extended bilaterally bluntly. The infant was successfully delivered, and cord was clamped and cut and infant was handed over to awaiting neonatology team. Uterine massage was then administered and the placenta delivered intact with three-vessel cord. The uterus was then cleared of clot and debris.  The hysterotomy was examined and found to have an extension into the vagina, which was bleeding profusely.  The hysterotomy and extension were closed with 0 Vicryl in a running locked fashion, and an imbricating layer was also placed with a 0 Vicryl. Overall, excellent hemostasis was noted. The abdomen and the pelvis were cleared of all clot and debris and the Jon Gillslexis was removed. Hemostasis was confirmed on all surfaces.  The peritoneum was reapproximated using 2-0 vicryl running stitches. The fascia was then closed using 0 Vicryl in a running fashion.  The subcutaneous layer was reapproximated with plain gut and the skin was closed with 4-0 vicryl. The patient tolerated the procedure well. Sponge, lap, instrument and needle counts were correct x 2.  She was taken  to the recovery room in stable condition.    Candelaria CelesteSTINSON, [name] [name] JEHIEL DO 03/17/2014 12:01 PM

## 2014-03-17 NOTE — Anesthesia Preprocedure Evaluation (Signed)
Anesthesia Evaluation  Patient identified by MRN, date of birth, ID band Patient awake    Reviewed: Allergy & Precautions, H&P , NPO status , Patient's Chart, lab work & pertinent test results  Airway Mallampati: II  TM Distance: >3 FB Neck ROM: full    Dental no notable dental hx.    Pulmonary neg pulmonary ROS,    Pulmonary exam normal       Cardiovascular negative cardio ROS      Neuro/Psych negative neurological ROS     GI/Hepatic Neg liver ROS,   Endo/Other  negative endocrine ROS  Renal/GU negative Renal ROS     Musculoskeletal   Abdominal   Peds  Hematology negative hematology ROS (+)   Anesthesia Other Findings   Reproductive/Obstetrics (+) Pregnancy                             Anesthesia Physical Anesthesia Plan  ASA: II and emergent  Anesthesia Plan: Spinal   Post-op Pain Management:    Induction:   Airway Management Planned:   Additional Equipment:   Intra-op Plan:   Post-operative Plan:   Informed Consent: I have reviewed the patients History and Physical, chart, labs and discussed the procedure including the risks, benefits and alternatives for the proposed anesthesia with the patient or authorized representative who has indicated his/her understanding and acceptance.     Plan Discussed with: CRNA, Surgeon and Anesthesiologist  Anesthesia Plan Comments:         Anesthesia Quick Evaluation

## 2014-03-17 NOTE — Progress Notes (Signed)
I assisted Educational psychologistDiana RN with questions, by Orlan LeavensViria Alvarez Interpreter

## 2014-03-17 NOTE — Progress Notes (Signed)
I assisted Dr. Adrian BlackwaterStinson and Mechele DawleyLindsay,RN with explanation of care plan. [name] [name]  Interpreter.

## 2014-03-17 NOTE — Anesthesia Postprocedure Evaluation (Signed)
Anesthesia Post Note  Patient: [name] LairMaria [name]  Procedure(s) Performed: Procedure(s) (LRB): CESAREAN SECTION (N/A)  Anesthesia type: Spinal  Patient location: PACU  Post pain: Pain level controlled  Post assessment: Post-op Vital signs reviewed  Last Vitals:  Filed Vitals:   03/17/14 1245  BP: 94/79  Pulse: 101  Temp:   Resp: 18    Post vital signs: Reviewed  Level of consciousness: awake  Complications: No apparent anesthesia complications

## 2014-03-17 NOTE — Progress Notes (Signed)
I was present during the C-Section with Dr. Adrian BlackwaterStinson and helped Trinity HealthDevin with questions. Eda H Royal Interpreter.

## 2014-03-17 NOTE — Progress Notes (Addendum)
Tim LairMaria Sanchez is a 33 y.o. G3P2002 at 7533w4d admitted for postdates IOL Subjective: No analgesia since last night. Fatigued and states can't push any longer . Complete at 0713. Pushing for 1.5 hr.   Objective: BP 125/67 mmHg  Pulse 92  Temp(Src) 97.7 F (36.5 C) (Axillary)  Resp 20  Ht 5' (1.524 m)  Wt 88.451 kg (195 lb)  BMI 38.08 kg/m2  LMP 05/07/2013  Fetal Heart FHR: 120 bpm, variability: moderate,  accelerations:  Present,  decelerations:  Present pushing variables mild with prompt return   Contractions: q 2-3, pit at 3 mu, fairly good pushing effort  SVE:   Dilation: 10 Effacement (%): 100 Station: 0 Exam by:: Dr. Janee Mornhompson OA, clear fluid, large caput +1, clear AF  Assessment / Plan:  Labor: Protracted 2nd stage> notify Dr. Adrian BlackwaterStinson who will come see pt. Increased pit to 4 mu Fetal Wellbeing: Category 1 Pain Control:  Declines med Expected mode of delivery: hopeful for  NSVD  Bethenny Losee 03/17/2014, 8:49 AM

## 2014-03-17 NOTE — Consult Note (Deleted)
Neonatology Note:  Attendance at Code Apgar:   Our team responded to a Code Apgar call to room # 172 following precipitous vaginal delivery at 28 3/7 weeks after starting an induction for maternal HELLP. The requesting physician was Dr. Seymour BarsLavoie. The mother is a G3P2 O pos, GBS neg with known IUGR and HELLP. ROM occurred 5 hours PTD and the fluid was clear. The mother received 1 dose of Betamethasone about 24 hours prior to delivery. At delivery, the baby cried and had good tone and HR. Our team arrived at 30 seconds of life, at which time the baby was breathing, but with retractions, and HR was about 110. We put a thermal cap on the baby and placed a pulse oximeter; while waiting for the neopuff to arrive, I gave occasional PPV breaths to expand her lungs and maintain normal HR. We titrated FIO2 to keep the O2 saturations within expected parameters. As soon as it was available (about 3-4 minutes), we placed the baby into the portawarmer bag and put the neopuff on her at +5. She only needed about 30% FIO2 to maintain adequate saturations. Ap 6/8.  I spoke with the mother in the DR, then transported the baby to the NICU on the neopuff, for further care. Doretha Souhristie C. Rilya Longo, MD

## 2014-03-17 NOTE — Lactation Note (Signed)
This note was copied from the chart of [name] [name]. Lactation Consultation Note  Assisted experienced BF mother to position baby skin to skin for a blood draw.  Baby cried during the procedure but quickly settled and began BF.  Mom reported some discomfort with BF so I discussed an off center latch with her.  Baby was re-latched and mom reported increased comfort. She will continue to work on latch.  I explained lactation brochure to her including OP support. Patient Name: [name] [name] [id number]'VToday's Date: 03/17/2014 Reason for consult: Initial assessment   Maternal Data Has patient been taught Hand Expression?: No (Experienced mother reports knowing how.  Teach as needed) Does the patient have breastfeeding experience prior to this delivery?: Yes  Feeding Feeding Type: Breast Fed Length of feed: 60 min  LATCH Score/Interventions Latch: Grasps breast easily, tongue down, lips flanged, rhythmical sucking.  Audible Swallowing: None  Type of Nipple: Everted at rest and after stimulation  Comfort (Breast/Nipple): Filling, red/small blisters or bruises, mild/mod discomfort     Hold (Positioning): No assistance needed to correctly position infant at breast. Intervention(s): Breastfeeding basics reviewed;Position options;Skin to skin  LATCH Score: 7  Lactation Tools Discussed/Used     Consult Status Consult Status: Follow-up Date: 03/18/14 Follow-up type: In-patient    Soyla DryerJoseph, Colisha Redler 03/17/2014, 4:35 PM

## 2014-03-17 NOTE — Progress Notes (Signed)
Dr Adrian BlackwaterStinson explained risks and benefits of primary c-section for arrest of descent.  Pt verbalized understanding and signed consent. Interpreter, Eda, present for explaination of consent.

## 2014-03-18 ENCOUNTER — Encounter (HOSPITAL_COMMUNITY): Payer: Self-pay | Admitting: Family Medicine

## 2014-03-18 LAB — CBC
HEMATOCRIT: 21.7 % — AB (ref 36.0–46.0)
Hemoglobin: 7.6 g/dL — ABNORMAL LOW (ref 12.0–15.0)
MCH: 30.6 pg (ref 26.0–34.0)
MCHC: 35 g/dL (ref 30.0–36.0)
MCV: 87.5 fL (ref 78.0–100.0)
Platelets: 157 10*3/uL (ref 150–400)
RBC: 2.48 MIL/uL — ABNORMAL LOW (ref 3.87–5.11)
RDW: 14.6 % (ref 11.5–15.5)
WBC: 14.3 10*3/uL — ABNORMAL HIGH (ref 4.0–10.5)

## 2014-03-18 LAB — RPR

## 2014-03-18 LAB — BIRTH TISSUE RECOVERY COLLECTION (PLACENTA DONATION)

## 2014-03-18 NOTE — Progress Notes (Signed)
Subjective: Postpartum Day 1: Cesarean Delivery Patient reports incisional pain, tolerating PO and no problems voiding.  Pt states she is having pain with her incision but was afraid to take the Percocet because she has already been having trouble stooling.  I reassured patient to take the Percocet and the Colace with it to prevent severe constipation and stay on top of the pain.    Objective: Vital signs in last 24 hours: Temp:  [97.7 F (36.5 C)-99 F (37.2 C)] 98.5 F (36.9 C) (12/23 0354) Pulse Rate:  [71-124] 74 (12/23 0354) Resp:  [12-29] 18 (12/23 0354) BP: (90-130)/(40-95) 99/46 mmHg (12/23 0354) SpO2:  [97 %-100 %] 100 % (12/23 0354)  Physical Exam:  General: alert, cooperative, appears stated age and no distress Lochia: appropriate Uterine Fundus: firm Incision: healing well, no significant drainage, no dehiscence, no significant erythema DVT Evaluation: No evidence of DVT seen on physical exam. Negative Homan's sign.   Recent Labs  03/17/14 1600 03/18/14 0545  HGB 9.4* 7.6*  HCT 26.8* 21.7*    Assessment/Plan: Status post Cesarean section. Doing well postoperatively.  Continue current care.  Benjamin Stainhompson, Zionna Homewood L, MD 03/18/2014, 7:49 AM

## 2014-03-18 NOTE — Progress Notes (Signed)
Clinical Social Work Department BRIEF PSYCHOSOCIAL ASSESSMENT 03/18/2014  Patient:  [name] [name]     Account Number:  [id number]     Admit date:  03/16/2014  Clinical Social Worker:  Andrewjames Weirauch, CLINICAL SOCIAL WORKER  Date/Time:  03/18/2014 01:00 PM  Referred by:  RN  Date Referred:  03/17/2014 Referred for  Other - See comment   Other Referral:   [name] presents with history of depression and postpartum depression.   Interview type:  Family  PSYCHOSOCIAL DATA Living Status:  FAMILY Admitted from facility:   Level of care:   Primary support name:  [name] [name] Primary support relationship to patient:  SPOUSE Degree of support available:   [name] reported strong family support.  She stated that she lives with the [name] and their 9 year old son.   CURRENT CONCERNS Current Concerns  Behavioral Health Issues: [name] reported "sadness" during the pregnancy.    SOCIAL WORK ASSESSMENT / PLAN CSW met with the [name] due to receiving referral for maternal history of depression and postpartum depression.  [name] presented as easily engaged as she readily interacted with CSW and discussed her mental health history.  [name] did not display any acute symptoms as she presented with a bright and cheerful affect and was observed to be in a pleasant mood.  She discussed excitement upon the birth of her daughter, and shared that she is looking forward to having a new addition in her family.   Throughout the visit, CSW provided supportive listening, validated her feelings, and assisted the [name] to process her thoughts and feelings.  The [name] acknowledged reason for CSW visit.  She shared that her oldest daughter currently lives in [place] with her family.  She discussed the sadness that she feels secondary to their transition since she has not seen her since she moved to the [place] in [place].  [name] shared hopes of reuniting someday, but discussed that for now it is difficult since they can only talk on the  phone.  Per [name], she cried frequently during the pregnancy due to missing her daughter, but also shared belief that it may have been related to her hormonal changes.  [name] denied belief that she has depression since it does not negatively impact her ability to engage in daily activities.  She frequently reported, "it's just sadness", and shared that by focusing on other activities and interacting with her family, she notes less sadness and crying.   [name] stated that she has met with a therapist in the past, who also assisted her with different coping skills.  [name] also endorsed history of postpartum depression that occurred for 40 days after her son "[name]" was born.  The [name] denied feeling concerned about her current mental health as she transitions into the postpartum period since she has positive family support.  She shared that she will be receiving extra support/assistance from her sister, and discussed belief that she will not be allowed to isolate for too long, enough if she wants to since her family is highly involved.  The [name] denied history of engaging in self-injurious behaviors and denied history of suicidal thoughts.  [name] acknowledged need to consult with her MD or visit an ED if she were to note these symptoms.  She acknowledged that she may need to further discuss her sadness if she notes that her symptoms negatively impact her ability to function/engage in daily activities.   Assessment/plan status:  No Further Intervention Required/No barriers to discharge Other assessment/ plan:     CSW reviwed symptoms of postpartum depression.   Information/referral to community resources:   No referrals needed at this time as [name] reported no current concerns related to her mental health.CSW reviewed how to access mental health services if she notes increase in symptoms.    PATIENT'S/FAMILY'S RESPONSE TO PLAN OF CARE: [name] and [name] expressed appreciation for the visit.  The [name] acknowledged need to talk to  her MD if she notes increase in symptoms or if she notes that her sadness negatively impacts her ability to complete daily activities.

## 2014-03-18 NOTE — Lactation Note (Signed)
This note was copied from the chart of [name] [name]. Lactation Consultation Note Follow up visit at 34 hours of age.  Mom reports feeling like she will have more milk tomorrow.  Baby has been feeding for about 5 minutes on each breast and bottle feeding per mom's desire.  Mom breastfed older children 18-21 months but mostly at night and not during the day when baby was getting bottles.  Encouraged mom to allow baby to feed on one size deeply for at least 15 minutes if she can get baby to feed that long and then offer the other side as needed or start on the other side with next feeding.  Encouraged mom to offer less formula so she makes more milk.   Mom has questions about ankle swelling and requests med, discussed that can affect her milk supply and then she declines.  Mom has been in bed mostly due to pain.  Discussed at length mom taking pain medicine so she can care for baby and walk to help her recovery.  Reported to [facility] RN Clydie BraunKaren.  Mom reports deep latch, but some nipple pain although nipple look WNL and erect.  Encouraged mom to express colostrum and rub into nipples.  Mom to call for assist as needed.    Patient Name: [name] [name] XBJYN'WToday's Date: 03/18/2014 Reason for consult: Follow-up assessment   Maternal Data    Feeding Feeding Type: Bottle Fed - Formula Nipple Type: Slow - flow Length of feed: 10 min  LATCH Score/Interventions                      Lactation Tools Discussed/Used     Consult Status Consult Status: Follow-up Date: 03/19/14 Follow-up type: In-patient    [name], [name] 03/18/2014, 9:18 PM

## 2014-03-18 NOTE — Progress Notes (Signed)
I stopped by to check on patient's needs.  Eda H Royal Interpreter. °

## 2014-03-18 NOTE — Progress Notes (Signed)
I ordered patient's lunch.  [name] [name]  Interpreter. °

## 2014-03-18 NOTE — Progress Notes (Signed)
I stopped by patients room to check on her needs, Donn PieriniJuana Nurse Tech was present at the time, by Clear Channel CommunicationsViria Alvarez Interpreter.

## 2014-03-19 NOTE — Progress Notes (Signed)
Stopped by to check on patient and ordered her lunch. Eda H Royal Interpreter.

## 2014-03-19 NOTE — Progress Notes (Signed)
Post Partum Day 2 Subjective:  [name] [name] is a 33 y.o. G3P3003 5919w4d s/p pLTCS.  No acute events overnight.  Pt denies problems with ambulating, voiding or po intake.  She denies nausea or vomiting.  Pain is moderately controlled.  She has had flatus. She has not had bowel movement.  Lochia Moderate.  Plan for birth control is OCPs.  Method of Feeding: breast  Objective: Blood pressure 90/40, pulse 61, temperature 98.1 F (36.7 C), temperature source Oral, resp. rate 16, height 5' (1.524 m), weight 195 lb (88.451 kg), last menstrual period 05/07/2013, SpO2 98 %, unknown if currently breastfeeding.  Physical Exam:  General: alert, cooperative and no distress Lochia:normal flow Chest: CTAB Heart: RRR no m/r/g Abdomen: +BS, soft, nontender,  Uterine Fundus: firm DVT Evaluation: No evidence of DVT seen on physical exam. Extremities: no edema   Recent Labs  03/17/14 1600 03/18/14 0545  HGB 9.4* 7.6*  HCT 26.8* 21.7*    Assessment/Plan:  ASSESSMENT: [name] [name] is a 33 y.o. G3P3003 4719w4d s/p pLTCS  Plan for discharge tomorrow   LOS: 3 days   Tamani Durney ROCIO 03/19/2014, 2:59 PM

## 2014-03-19 NOTE — Progress Notes (Signed)
I stopped to check on patients needs, she request blankets for baby, by Orlan LeavensViria Alvarez Interpreter.

## 2014-03-19 NOTE — Lactation Note (Signed)
This note was copied from the chart of [name] [name]. Lactation Consultation Note With an Interpreter asked mom if BF was going well. Stated yes. BF at time of entering room in cradle position. Denies soreness to nipples. Mom is breast and bottle feeding. Doesn't have a hand pump and would like one. Gave pump as requested. Not sure if going home today or not. Encouraged to write down all feedings pee's and poops. Encouraged to massage breast during BF and BF before bottle feeding. States doesn't have any other questions or concerns at this time.  Patient Name: [name] [name] ZOXWR'UToday's Date: 03/19/2014 Reason for consult: Follow-up assessment   Maternal Data    Feeding Feeding Type: Breast Fed Length of feed: 20 min  LATCH Score/Interventions Latch: Grasps breast easily, tongue down, lips flanged, rhythmical sucking.  Audible Swallowing: A few with stimulation  Type of Nipple: Everted at rest and after stimulation  Comfort (Breast/Nipple): Soft / non-tender     Hold (Positioning): No assistance needed to correctly position infant at breast.  LATCH Score: 9  Lactation Tools Discussed/Used Tools: Pump Breast pump type: Manual Initiated by:: Peri JeffersonL. Aniken Monestime RN Date initiated:: 03/19/14   Consult Status Consult Status: Complete Date: 03/19/14    Charyl DancerCARVER, Eve Rey G 03/19/2014, 12:38 PM

## 2014-03-20 MED ORDER — LACTULOSE 10 GM/15ML PO SOLN
20.0000 g | Freq: Two times a day (BID) | ORAL | Status: DC
  Filled 2014-03-20: qty 30

## 2014-03-20 MED ORDER — OXYCODONE-ACETAMINOPHEN 5-325 MG PO TABS: 1.0000 | ORAL_TABLET | ORAL | Status: AC | PRN

## 2014-03-20 MED ORDER — LACTULOSE 10 GM/15ML PO SOLN: 20.0000 g | Freq: Two times a day (BID) | ORAL | Status: AC | PRN

## 2014-03-20 NOTE — Discharge Instructions (Signed)
Parto por cesrea - Cuidados posteriores  (Cesarean Delivery, Care After) Siga estas instrucciones durante las prximas semanas. Estas indicaciones le proporcionan informacin general acerca de cmo deber cuidarse despus del procedimiento. El mdico tambin podr darle instrucciones ms especficas. El tratamiento se ha planificado de acuerdo a las prcticas mdicas actuales, pero a veces se producen problemas. Comunquese con el mdico si tiene algn problema o tiene dudas cuando vuelva a su casa.  INSTRUCCIONES PARA EL CUIDADO EN EL HOGAR  Tome slo medicamentos de venta libre o recetados, segn las indicaciones del mdico.  No beba alcohol, especialmente si est amamantando o toma analgsicos.  Nomastique tabaco ni fume.  Contine con un adecuado cuidado perineal. El buen cuidado perineal incluye:  Higienizarse de adelante hacia atrs.  Mantener la zona perineal limpia.  Controlar diariamente el corte (incisin) y observar si aumenta el enrojecimiento, si supura, se hincha o se separa la piel.  Limpie la incisin suavemente con jabn y agua todos los das, y luego squela dando golpecitos. Si el mdico la autoriza, deje la incisin al descubierto. Use un apsito (vendaje) si drena lquido o la incisin parece irritada. Si las pequeas tiras [facility] que cruzan la incisin no se caen dentro de los 7 das, retrelas suavemente.  Abrace una almohada al toser o estornudar hasta que la incisin se cure. Esto ayuda a Best boy.  No conduzca vehculos ni opere maquinarias hasta que el mdico la autorice.  Dchese, lvese el cabello y tome baos de inmersin segn las indicaciones de su mdico.  Utilice un sostn que le ajuste bien y que brinde buen soporte a sus Glass blower/designer.  Limite el uso de bombachas de sostn o medias panty.  Beba suficiente lquido para Consulting civil engineer orina clara o de color amarillo plido.  Consuma todos los das alimentos ricos en fibra como cereales y panes  [place], arroz, frijoles y frutas frescas y verduras. Estos alimentos pueden ayudarla a prevenir o Cytogeneticist.  Reanude las actividades como subir escaleras, conducir automviles, levantar objetos pesados, hacer ejercicios o viajar cuando le indique su mdico.  Hable con su mdico acerca de reanudar la actividad sexual. Volver a la actividad sexual depende del riesgo de infeccin, la velocidad de la curacin y la comodidad y su deseo de Financial controller.  Trate de que alguien la ayude con las actividades del hogar y con el recin nacido al menos durante algunos das despus de salir del hospital.  Descanse todo lo que pueda. Trate de descansar o tomar una siesta mientras el beb est durmiendo.  Aumente sus actividades gradualmente.  Cumpla con todos los controles programados para despus del [place]. Es muy importante asistir a todas las visitas de Nurse, adult. En estas visitas, su mdico va a controlarla para asegurarse de que est sanando fsica y emocionalmente. SOLICITE ATENCIN MDICA SI:   Elimina cogulos grandes por la vagina. Guarde algunos cogulos para mostrarle al mdico.  Tiene una secrecin con feo olor que proviene de la vagina.  Tiene dificultad para orinar.  Orina con frecuencia.  Siente dolor al [employer].  Nota un cambio en sus movimientos intestinales.  Aumenta el enrojecimiento, el dolor o la hinchazn en la zona de la incisin.  Observa que supura pus en la incisin.  La incisin se abre.  Sus [employer] duelen, estn duras o enrojecidas.  Sufre un dolor intenso de [place].  Tiene visin borrosa o ve manchas.  Se siente triste o deprimida.  Tiene pensamientos acerca de lastimarse o daar al  recin nacido.  Tiene preguntas acerca de su cuidado, la atencin del recin nacido o acerca de los medicamentos.  Se siente mareada o sufre un desmayo.  Tiene una erupcin.  Siente dolor u observa enrojecimiento o hinchazn en el sitio en que  estaba la va intravenosa (IV).  Tiene nuseas o vmitos.  Usted dej de amamantar al beb y no ha tenido su perodo menstrual dentro de las 12 semanas siguientes.  No amamanta al beb y no tuvo su perodo menstrual en las ltimas 12 semanas.  Tiene fiebre. SOLICITE ATENCIN MDICA DE INMEDIATO SI:   Siente dolor persistente.  Siente dolor en el pecho.  Le falta el aire.  Se desmaya.  Siente dolor en la pierna.  Siente Research scientist (life sciences).  El sangrado vaginal satura dos o ms apsitos en 1 hora. ASEGRESE DE QUE:   Comprende estas instrucciones.  Controlar su enfermedad.  Recibir ayuda de inmediato si no mejora o si empeora. Document Released: 03/13/2005 Document Revised: 07/28/2013 [facility] Patient Information 2015 [place], [place]. This information is not intended to replace advice given to you by your health care provider. Make sure you discuss any questions you have with your health care provider.

## 2014-03-20 NOTE — Progress Notes (Signed)
I stopped by patients room to check on her needs, at the time she was asleep, by [name] LeavensViria [name] Interpreter

## 2014-03-20 NOTE — Discharge Summary (Signed)
  Obstetric Discharge Summary Reason for Admission: induction of labor for postdates Prenatal Procedures: NST and ultrasound Intrapartum Procedures: cesarean: low cervical, transverse Postpartum Procedures: none Complications-Operative and Postpartum: none  Delivery Note At 10:55 AM a viable female was delivered via C-Section, Low Transverse (Presentation: ; Occiput Posterior).  APGAR: 9, 9; weight 8 lb 4.5 oz (3755 g).     Hospital Course:  Active Problems:   Post-dates pregnancy   [name] LairMaria [name] is a 33 y.o. G3P3003 s/p pLTCS after failed induction of labor.uncomplicated including no problems with ambulating, PO intake, urination, pain, or bleeding. The pt feels ready to go home and  will be discharged with outpatient follow-up.   Today: No acute events overnight.  Pt denies problems with ambulating, voiding or po intake.  She denies nausea or vomiting.  Pain is well controlled.  She has had flatus. She has not had bowel movement.  Lochia Moderate. Method of Feeding: breast  Very concerned about constipation, has not had bowel movement and regularly feels constipated.  H/H: Lab Results  Component Value Date/Time   HGB 7.6* 03/18/2014 05:45 AM   HCT 21.7* 03/18/2014 05:45 AM    Discharge Diagnoses: Term Pregnancy-delivered  Discharge Information: Date: 03/20/2014 Activity: pelvic rest Diet: routine  Medications: Percocet and lactulose Breast feeding:  Yes Condition: stable Instructions: refer to handout Discharge to: home   Discharge Instructions    Call MD for:  redness, tenderness, or signs of infection (pain, swelling, redness, odor or green/yellow discharge around incision site)    Complete by:  As directed      Call MD for:  severe uncontrolled pain    Complete by:  As directed      Call MD for:  temperature >100.4    Complete by:  As directed      Diet - low sodium heart healthy    Complete by:  As directed             Medication List    TAKE these  medications        acetaminophen 325 MG tablet  Commonly known as:  TYLENOL  Take 650 mg by mouth every 6 (six) hours as needed.     oxyCODONE-acetaminophen 5-325 MG per tablet  Commonly known as:  PERCOCET/ROXICET  Take 1 tablet by mouth every 4 (four) hours as needed (for pain scale less than 7).     prenatal multivitamin Tabs tablet  Take 1 tablet by mouth daily at 12 noon.           Follow-up Information    [facility] [place] In 6 weeks.   Contact information:   [address] [place] [id number] [phone]      Perry MountACOSTA,Shakila Mak ROCIO ,MD OB Fellow 03/20/2014,7:02 AM

## 2014-03-20 NOTE — Progress Notes (Signed)
Eta the interpreter into do DC teaching with patient. All questions answered.

## 2014-09-15 IMAGING — US US OB FOLLOW-UP
1 of 2 series · 12 of 28 positions shown · non-contrast
Comparison: none

[Series 1: us ob follow up · 12 of 65 slices shown]
[im 1/65]
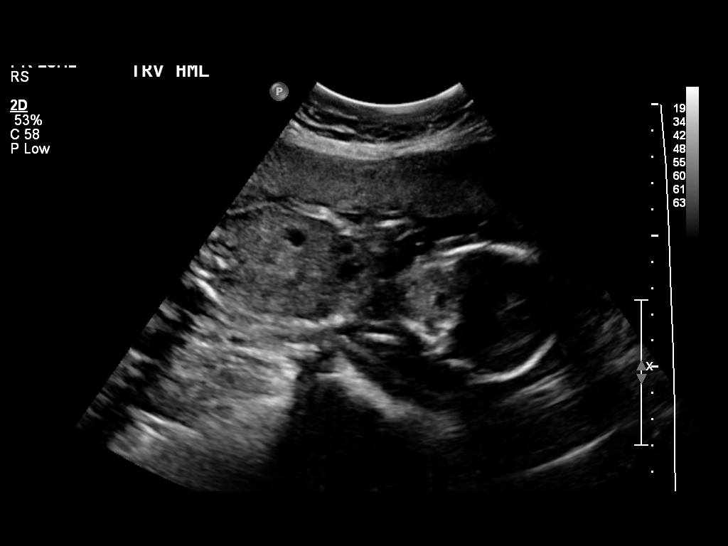
[im 5/65]
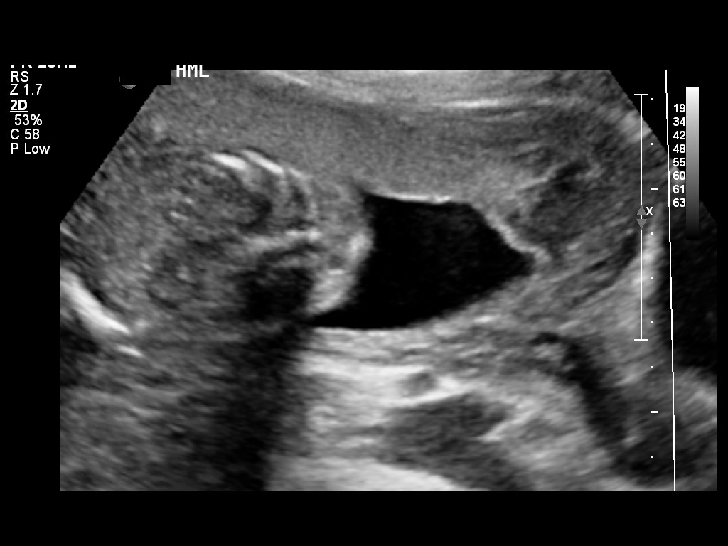
[im 10/65]
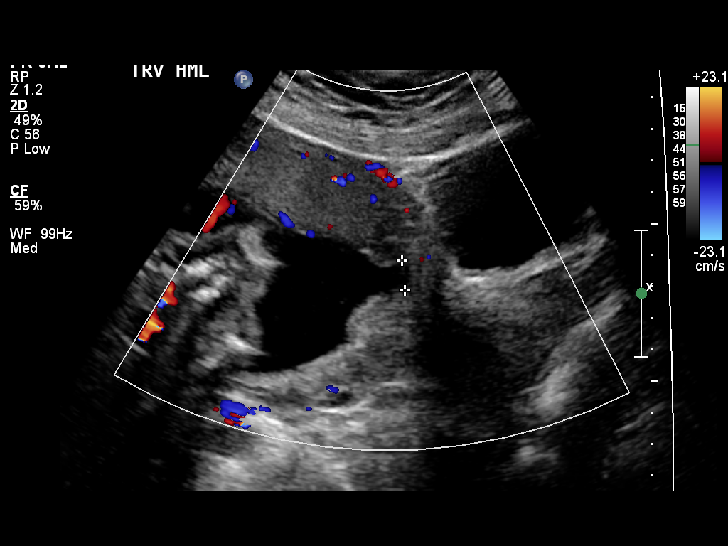
[im 18/65]
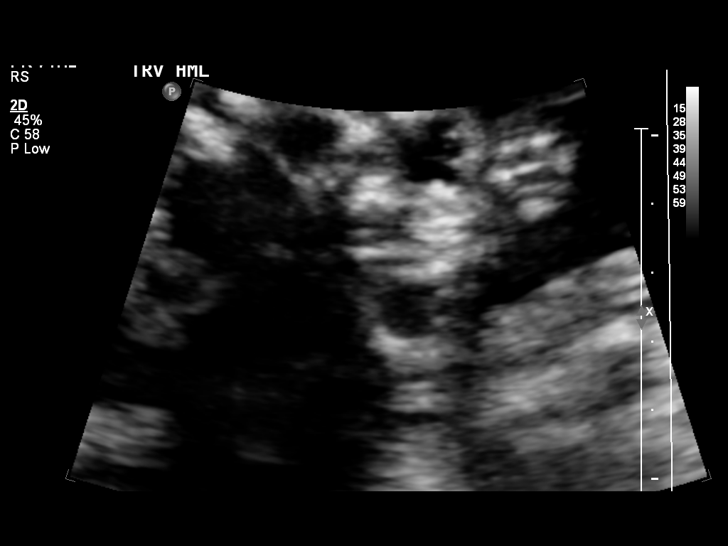
[im 23/65]
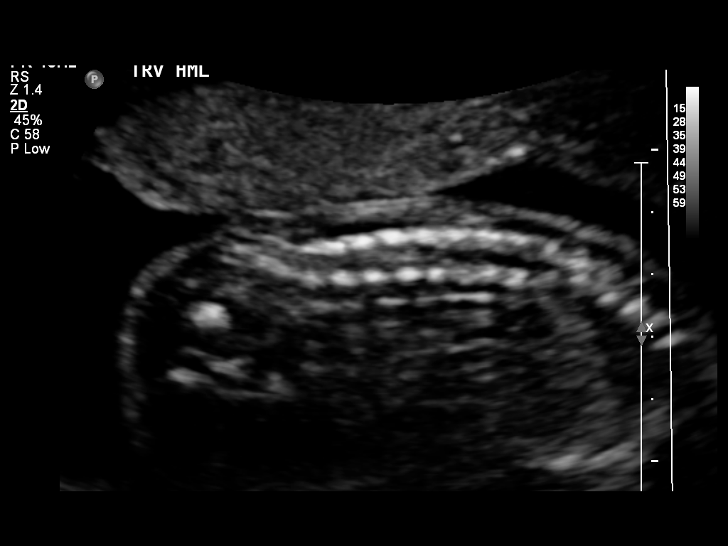
[im 28/65]
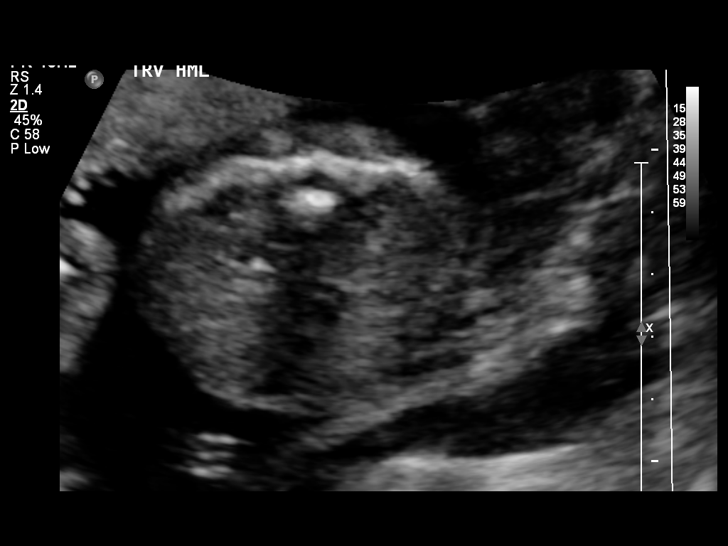
[im 35/65]
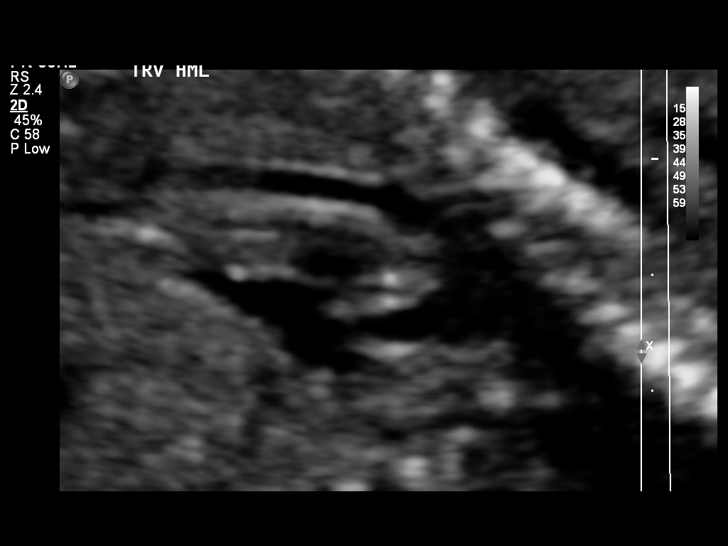
[im 40/65]
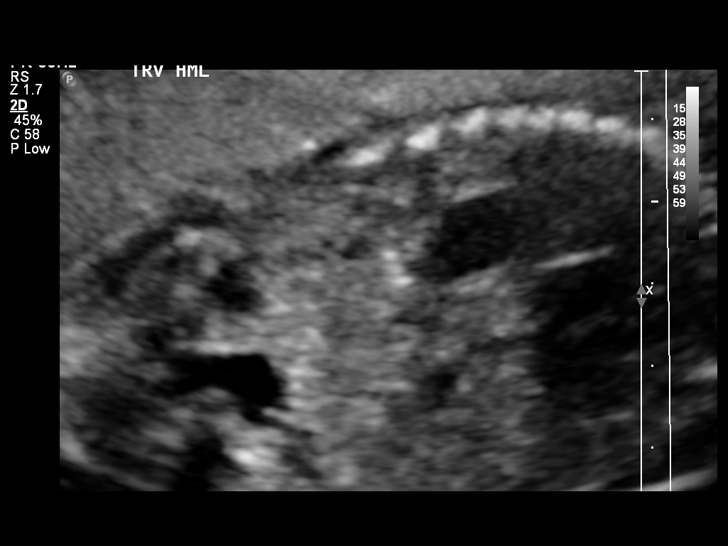
[im 45/65]
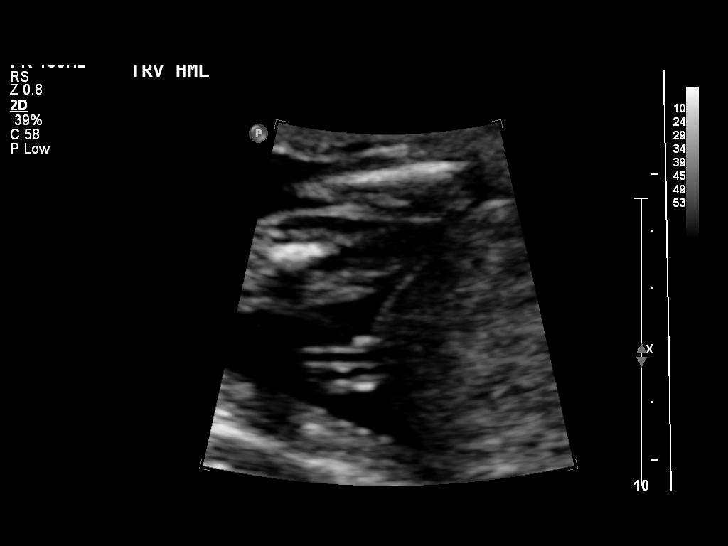
[im 52/65]
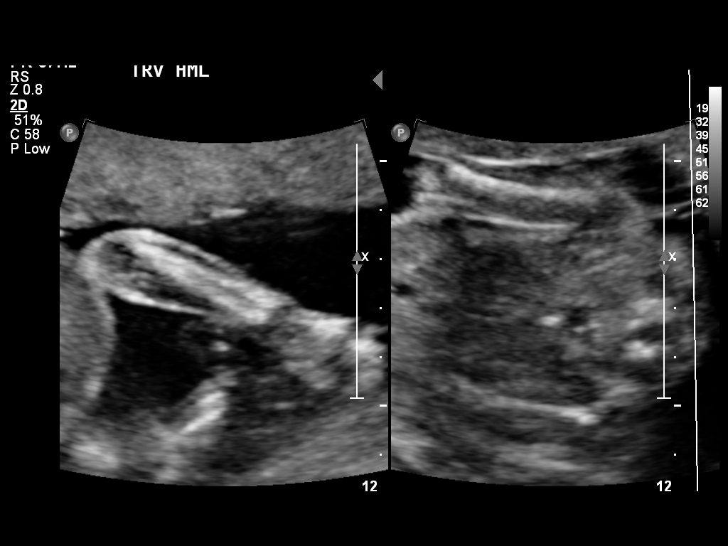
[im 57/65]
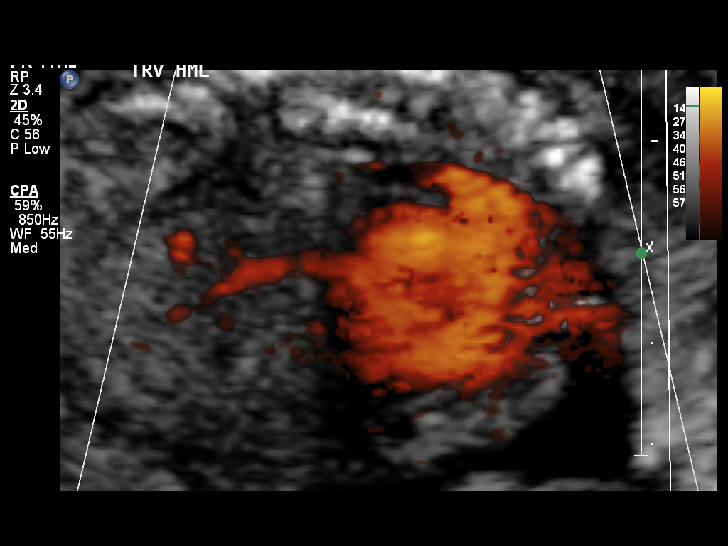
[im 62/65]
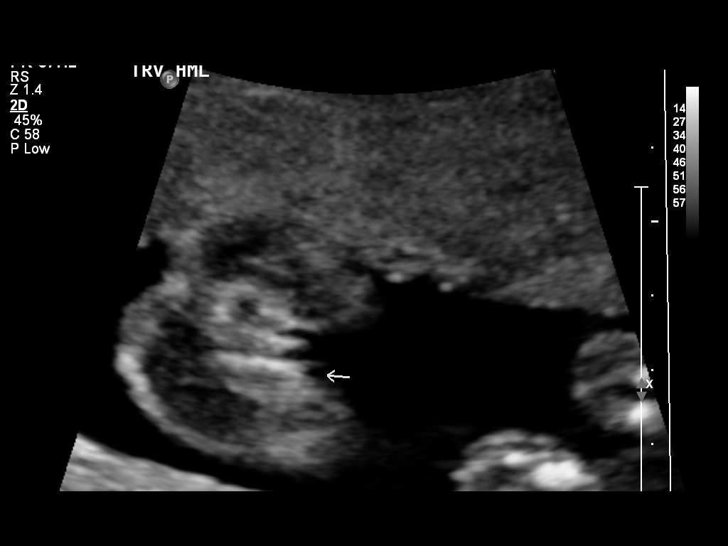

[12 of 28 positions shown; findings below may reference images not displayed]

OBSTETRICS REPORT
                      (Signed Final 10/27/2013 [DATE])

Service(s) Provided

 US OB FOLLOW UP                                       76816.1
Indications

 Follow-up incomplete fetal anatomic evaluation
Fetal Evaluation

 Num Of Fetuses:    1
 Fetal Heart Rate:  136                          bpm
 Cardiac Activity:  Observed
 Presentation:      Variable
 Placenta:          Anterior, above cervical os
 P. Cord            Visualized, central
 Insertion:

 Amniotic Fluid
 AFI FV:      Subjectively within normal limits
                                             Larg Pckt:     4.1  cm
Biometry

 BPD:     48.7  mm     G. Age:  20w 5d                CI:        73.21   70 - 86
                                                      FL/HC:      18.4   15.9 -

 HC:     180.9  mm     G. Age:  20w 3d        9  %    HC/AC:      1.07   1.06 -

 AC:       169  mm     G. Age:  21w 6d       58  %    FL/BPD:
 FL:      33.3  mm     G. Age:  20w 3d       13  %    FL/AC:      19.7   20 - 24

 Est. FW:     402  gm    0 lb 14 oz      37  %
Gestational Age

 LMP:           24w 5d        Date:  05/07/13                 EDD:   02/11/14
 U/S Today:     20w 6d                                        EDD:   03/10/14
 Best:          21w 3d     Det. By:  U/S (09/29/13)           EDD:   03/06/14
Anatomy

 Cranium:          Appears normal         Aortic Arch:      Appears normal
 Fetal Cavum:      Appears normal         Ductal Arch:      Appears normal
 Ventricles:       Appears normal         Diaphragm:        Appears normal
 Choroid Plexus:   Appears normal         Stomach:          Appears normal, left
                                                            sided
 Cerebellum:       Previously seen        Abdomen:          Appears normal
 Posterior Fossa:  Previously seen        Abdominal Wall:   Appears nml (cord
                                                            insert, abd wall)
 Nuchal Fold:      Not applicable (>20    Cord Vessels:     Appears normal (3
                   wks GA)                                  vessel cord)
 Face:             Orbits appear          Kidneys:          Appear normal
                   normal
 Lips:             Appears normal         Bladder:          Appears normal
 Heart:            Appears normal         Spine:            Appears normal;
                   (4CH, axis, and                          limited views s sp
                   situs)
 RVOT:             Not well visualized    Lower             Previously seen
                                          Extremities:
 LVOT:             Appears normal         Upper             Previously seen
                                          Extremities:

 Other:  Fetus appears to be a female. Technically difficult due to fetal position.
Targeted Anatomy

 Fetal Central Nervous System
 Lat. Ventricles:
Cervix Uterus Adnexa

 Cervical Length:    4.6      cm

 Cervix:       Normal appearance by transabdominal scan.
Impression

 SIUP at 21+3 weeks
 Normal detailed fetal anatomy; limited views of RVOT and
 profile; sacral sp not optimally visualized but appeared to be
 grossly normal
 Markers of aneuploidy: none
 Normal amniotic fluid volume
 Measurements consistent with prior US; EFW at the 37th
 %tile
Recommendations

 Follow-up as clinically indicated or could complete survey in 6-
 8 weeks

 questions or concerns.

## 2014-12-22 ENCOUNTER — Emergency Department (HOSPITAL_COMMUNITY): Payer: 59

## 2014-12-22 ENCOUNTER — Inpatient Hospital Stay (HOSPITAL_COMMUNITY)
Admission: EM | Admit: 2014-12-22 | Discharge: 2014-12-24 | DRG: 337 | Disposition: A | Discharge status: Home / Self Care | Payer: 59 | Attending: Surgery | Admitting: Surgery

## 2014-12-22 ENCOUNTER — Encounter (HOSPITAL_COMMUNITY): Payer: Self-pay | Admitting: Emergency Medicine

## 2014-12-22 DIAGNOSIS — K37 Unspecified appendicitis: Secondary | ICD-10-CM | POA: Diagnosis present

## 2014-12-22 DIAGNOSIS — K353 Acute appendicitis with localized peritonitis, without perforation or gangrene: Secondary | ICD-10-CM

## 2014-12-22 DIAGNOSIS — R1031 Right lower quadrant pain: Secondary | ICD-10-CM | POA: Diagnosis present

## 2014-12-22 DIAGNOSIS — K660 Peritoneal adhesions (postprocedural) (postinfection): Secondary | ICD-10-CM | POA: Diagnosis present

## 2014-12-22 DIAGNOSIS — D72829 Elevated white blood cell count, unspecified: Secondary | ICD-10-CM

## 2014-12-22 DIAGNOSIS — K3580 Unspecified acute appendicitis: Secondary | ICD-10-CM | POA: Diagnosis present

## 2014-12-22 LAB — URINALYSIS, ROUTINE W REFLEX MICROSCOPIC
Bilirubin Urine: NEGATIVE
Glucose, UA: NEGATIVE mg/dL
Ketones, ur: 15 mg/dL — AB
NITRITE: NEGATIVE
PROTEIN: 30 mg/dL — AB
Specific Gravity, Urine: 1.029 (ref 1.005–1.030)
UROBILINOGEN UA: 0.2 mg/dL (ref 0.0–1.0)
pH: 8 (ref 5.0–8.0)

## 2014-12-22 LAB — COMPREHENSIVE METABOLIC PANEL
ALK PHOS: 72 U/L (ref 38–126)
ALT: 18 U/L (ref 14–54)
ANION GAP: 11 (ref 5–15)
AST: 19 U/L (ref 15–41)
Albumin: 3.4 g/dL — ABNORMAL LOW (ref 3.5–5.0)
BILIRUBIN TOTAL: 0.6 mg/dL (ref 0.3–1.2)
BUN: 6 mg/dL (ref 6–20)
CALCIUM: 8.4 mg/dL — AB (ref 8.9–10.3)
CO2: 25 mmol/L (ref 22–32)
Chloride: 101 mmol/L (ref 101–111)
Creatinine, Ser: 0.63 mg/dL (ref 0.44–1.00)
GFR calc non Af Amer: >60 mL/min (ref >60)
Glucose, Bld: 120 mg/dL — ABNORMAL HIGH (ref 65–99)
Potassium: 3.2 mmol/L — ABNORMAL LOW (ref 3.5–5.1)
SODIUM: 137 mmol/L (ref 135–145)
TOTAL PROTEIN: 6.6 g/dL (ref 6.5–8.1)

## 2014-12-22 LAB — CBC
HCT: 36.3 % (ref 36.0–46.0)
HEMOGLOBIN: 12.5 g/dL (ref 12.0–15.0)
MCH: 29.3 pg (ref 26.0–34.0)
MCHC: 34.4 g/dL (ref 30.0–36.0)
MCV: 85 fL (ref 78.0–100.0)
Platelets: 443 10*3/uL — ABNORMAL HIGH (ref 150–400)
RBC: 4.27 MIL/uL (ref 3.87–5.11)
RDW: 13.6 % (ref 11.5–15.5)
WBC: 19.9 10*3/uL — ABNORMAL HIGH (ref 4.0–10.5)

## 2014-12-22 LAB — I-STAT BETA HCG BLOOD, ED (MC, WL, AP ONLY)

## 2014-12-22 LAB — URINE MICROSCOPIC-ADD ON

## 2014-12-22 LAB — LIPASE, BLOOD: Lipase: 20 U/L — ABNORMAL LOW (ref 22–51)

## 2014-12-22 MED ORDER — DIPHENHYDRAMINE HCL 12.5 MG/5ML PO ELIX: 12.5000 mg | ORAL_SOLUTION | Freq: Four times a day (QID) | ORAL | Status: DC | PRN

## 2014-12-22 MED ORDER — ONDANSETRON HCL 4 MG/2ML IJ SOLN
4.0000 mg | Freq: Four times a day (QID) | INTRAMUSCULAR | Status: DC | PRN
Start: 2014-12-22 — End: 2014-12-24

## 2014-12-22 MED ORDER — ENOXAPARIN SODIUM 40 MG/0.4ML ~~LOC~~ SOLN: 40.0000 mg | SUBCUTANEOUS | Status: DC

## 2014-12-22 MED ORDER — ZOLPIDEM TARTRATE 5 MG PO TABS: 5.0000 mg | ORAL_TABLET | Freq: Every evening | ORAL | Status: DC | PRN

## 2014-12-22 MED ORDER — IOHEXOL 300 MG/ML  SOLN
100.0000 mL | Freq: Once | INTRAMUSCULAR | Status: AC | PRN
  Administered 2014-12-22: 100 mL via INTRAVENOUS

## 2014-12-22 MED ORDER — SODIUM CHLORIDE 0.9 % IV BOLUS (SEPSIS)
1000.0000 mL | Freq: Once | INTRAVENOUS | Status: AC
  Administered 2014-12-22: 1000 mL via INTRAVENOUS

## 2014-12-22 MED ORDER — ACETAMINOPHEN 650 MG RE SUPP: 650.0000 mg | Freq: Four times a day (QID) | RECTAL | Status: DC | PRN

## 2014-12-22 MED ORDER — KCL IN DEXTROSE-NACL 20-5-0.9 MEQ/L-%-% IV SOLN
INTRAVENOUS | Status: DC
Start: 2014-12-22 — End: 2014-12-23
  Administered 2014-12-22: via INTRAVENOUS
  Filled 2014-12-22 (×4): qty 1000

## 2014-12-22 MED ORDER — METRONIDAZOLE IN NACL 5-0.79 MG/ML-% IV SOLN
500.0000 mg | Freq: Three times a day (TID) | INTRAVENOUS | Status: DC
  Administered 2014-12-22 – 2014-12-24 (×4): 500 mg via INTRAVENOUS
  Filled 2014-12-22 (×7): qty 100

## 2014-12-22 MED ORDER — HYDROMORPHONE HCL 1 MG/ML IJ SOLN: 1.0000 mg | INTRAMUSCULAR | Status: DC | PRN

## 2014-12-22 MED ORDER — ONDANSETRON 4 MG PO TBDP
4.0000 mg | ORAL_TABLET | Freq: Four times a day (QID) | ORAL | Status: DC | PRN
  Filled 2014-12-22: qty 1

## 2014-12-22 MED ORDER — METHOCARBAMOL 500 MG PO TABS: 500.0000 mg | ORAL_TABLET | Freq: Four times a day (QID) | ORAL | Status: DC | PRN

## 2014-12-22 MED ORDER — DEXTROSE 5 % IV SOLN
2.0000 g | Freq: Once | INTRAVENOUS | Status: AC
  Administered 2014-12-22: 2 g via INTRAVENOUS
  Filled 2014-12-22 (×2): qty 2

## 2014-12-22 MED ORDER — ACETAMINOPHEN 325 MG PO TABS: 650.0000 mg | ORAL_TABLET | Freq: Four times a day (QID) | ORAL | Status: DC | PRN

## 2014-12-22 MED ORDER — ONDANSETRON HCL 4 MG/2ML IJ SOLN
4.0000 mg | Freq: Once | INTRAMUSCULAR | Status: AC
  Administered 2014-12-22: 4 mg via INTRAVENOUS
  Filled 2014-12-22: qty 2

## 2014-12-22 MED ORDER — DEXTROSE 5 % IV SOLN
2.0000 g | INTRAVENOUS | Status: DC
  Administered 2014-12-22 – 2014-12-24 (×2): 2 g via INTRAVENOUS
  Filled 2014-12-22 (×2): qty 2

## 2014-12-22 MED ORDER — DIPHENHYDRAMINE HCL 50 MG/ML IJ SOLN: 12.5000 mg | Freq: Four times a day (QID) | INTRAMUSCULAR | Status: DC | PRN

## 2014-12-22 NOTE — H&P (Signed)
[name] [name] is an 34 y.o. female.   Chief Complaint: abdominal pain HPI: pt with a 1 day history of RLQ abdominal pain  Sharp 8/10.  Started last night.  One episode of nause  No radiation of pain.  No appetite  History reviewed. No pertinent past medical history.  History reviewed. No pertinent past surgical history.  History reviewed. No pertinent family history. Social History:  reports that she has never smoked. She does not have any smokeless tobacco history on file. She reports that she does not drink alcohol or use illicit drugs.  Allergies: No Known Allergies  No prescriptions prior to admission    Results for orders placed or performed during the hospital encounter of 12/22/14 (from the past 48 hour(s))  Lipase, blood     Status: Abnormal   Collection Time: 12/22/14  2:28 PM  Result Value Ref Range   Lipase 20 (L) 22 - 51 U/L  Comprehensive metabolic panel     Status: Abnormal   Collection Time: 12/22/14  2:28 PM  Result Value Ref Range   Sodium 137 135 - 145 mmol/L   Potassium 3.2 (L) 3.5 - 5.1 mmol/L   Chloride 101 101 - 111 mmol/L   CO2 25 22 - 32 mmol/L   Glucose, Bld 120 (H) 65 - 99 mg/dL   BUN 6 6 - 20 mg/dL   Creatinine, Ser 0.63 0.44 - 1.00 mg/dL   Calcium 8.4 (L) 8.9 - 10.3 mg/dL   Total Protein 6.6 6.5 - 8.1 g/dL   Albumin 3.4 (L) 3.5 - 5.0 g/dL   AST 19 15 - 41 U/L   ALT 18 14 - 54 U/L   Alkaline Phosphatase 72 38 - 126 U/L   Total Bilirubin 0.6 0.3 - 1.2 mg/dL   GFR calc non Af Amer >60 >60 mL/min   GFR calc Af Amer >60 >60 mL/min    Comment: (NOTE) The eGFR has been calculated using the CKD EPI equation. This calculation has not been validated in all clinical situations. eGFR's persistently <60 mL/min signify possible Chronic Kidney Disease.    Anion gap 11 5 - 15  CBC     Status: Abnormal   Collection Time: 12/22/14  2:28 PM  Result Value Ref Range   WBC 19.9 (H) 4.0 - 10.5 K/uL   RBC 4.27 3.87 - 5.11 MIL/uL   Hemoglobin 12.5 12.0 -  15.0 g/dL   HCT 36.3 36.0 - 46.0 %   MCV 85.0 78.0 - 100.0 fL   MCH 29.3 26.0 - 34.0 pg   MCHC 34.4 30.0 - 36.0 g/dL   RDW 13.6 11.5 - 15.5 %   Platelets 443 (H) 150 - 400 K/uL  Urinalysis, Routine w reflex microscopic (not at [facility])     Status: Abnormal   Collection Time: 12/22/14  2:29 PM  Result Value Ref Range   Color, Urine AMBER (A) YELLOW    Comment: BIOCHEMICALS MAY BE AFFECTED BY COLOR   APPearance CLOUDY (A) CLEAR   Specific Gravity, Urine 1.029 1.005 - 1.030   pH 8.0 5.0 - 8.0   Glucose, UA NEGATIVE NEGATIVE mg/dL   Hgb urine dipstick SMALL (A) NEGATIVE   Bilirubin Urine NEGATIVE NEGATIVE   Ketones, ur 15 (A) NEGATIVE mg/dL   Protein, ur 30 (A) NEGATIVE mg/dL   Urobilinogen, UA 0.2 0.0 - 1.0 mg/dL   Nitrite NEGATIVE NEGATIVE   Leukocytes, UA SMALL (A) NEGATIVE  Urine microscopic-add on     Status: Abnormal  Collection Time: 12/22/14  2:29 PM  Result Value Ref Range   Squamous Epithelial / LPF MANY (A) RARE   WBC, UA 3-6 <3 WBC/hpf   RBC / HPF 3-6 <3 RBC/hpf   Bacteria, UA FEW (A) RARE   Urine-Other MUCOUS PRESENT   I-Stat beta hCG blood, ED ([facility], [facility], [facility] only)     Status: None   Collection Time: 12/22/14  2:38 PM  Result Value Ref Range   I-stat hCG, quantitative <5.0 <5 mIU/mL   Comment 3            Comment:   GEST. AGE      CONC.  (mIU/mL)   <=1 WEEK        5 - 50     2 WEEKS       50 - 500     3 WEEKS       100 - 10,000     4 WEEKS     1,000 - 30,000        FEMALE AND NON-PREGNANT FEMALE:     LESS THAN 5 mIU/mL    Ct Abdomen Pelvis W Contrast  12/22/2014   CLINICAL DATA:  Two day history of right lower quadrant pain. One day history of vomiting  EXAM: CT ABDOMEN AND PELVIS WITH CONTRAST  TECHNIQUE: Multidetector CT imaging of the abdomen and pelvis was performed using the standard protocol following bolus administration of intravenous contrast.  CONTRAST:  19m OMNIPAQUE IOHEXOL 300 MG/ML  SOLN  COMPARISON:  None.  FINDINGS: Lower chest:  Lung bases are  clear.  Hepatobiliary: No focal liver lesions are identified. There is mild fatty infiltration near the fissure for the ligamentum teres. Gallbladder wall is not thickened. There is no biliary duct dilatation.  Pancreas: No mass or inflammatory focus.  Spleen: No focal lesion.  Adrenals/Urinary Tract: Adrenals appear normal bilaterally. Kidneys bilaterally show no mass or hydronephrosis on either side. There is no renal or ureteral calculus on either side. The urinary bladder is midline.  Stomach/Bowel: There is no bowel obstruction. No free air or portal venous air. No bowel wall or mesenteric thickening.  Vascular/Lymphatic: No abdominal aortic aneurysm. Major vessels appear widely patent. There is no appreciable adenopathy.  Reproductive: Uterus is anteverted. There is a cyst in the left ovary measuring 3.5 x 2.2 cm. No other pelvic mass identified.  Other: There is distention of the appendix with enhancement of portions of the appendiceal wall. There is surrounding periappendiceal inflammation. There is no well-defined abscess. Elsewhere, there is no abscess or ascites in the abdomen or pelvis.  Musculoskeletal: No intramuscular lesions are appreciable. There are no blastic or lytic bone lesions.  IMPRESSION: Evidence of acute appendiceal inflammation.  No frank abscess.  There is a left ovarian cyst measuring 3.5 x 2.2 cm.  Mild fatty infiltration in the liver near the fissure for the ligamentum teres.  No renal or ureteral calculus.  No hydronephrosis.  Critical Value/emergent results were called by telephone at the time of interpretation on 12/22/2014 at 8:24 pm to Dr. JDarlyn ChamberGADDY , who verbally acknowledged these results.   Electronically Signed   By: WLowella GripIII M.D.   On: 12/22/2014 20:25    Review of Systems  Constitutional: Positive for malaise/fatigue. Negative for chills.  HENT: Negative.   Eyes: Negative.   Respiratory: Negative.   Cardiovascular: Negative.   Gastrointestinal:  Positive for nausea and abdominal pain.  Musculoskeletal: Negative.   Psychiatric/Behavioral: Negative.     Blood  pressure 122/64, pulse 92, temperature 98.1 F (36.7 C), temperature source Oral, resp. rate 16, height 5' (1.524 m), weight 81.829 kg (180 lb 6.4 oz), last menstrual period 12/01/2014, SpO2 97 %. Physical Exam  Constitutional: She appears well-developed and well-nourished.  HENT:  Head: Normocephalic and atraumatic.  Eyes: Pupils are equal, round, and reactive to light.  Neck: Normal range of motion. Neck supple.  Cardiovascular: Normal rate.   Respiratory: Effort normal.  GI: There is tenderness. There is rebound and tenderness at McBurney's point. There is no guarding.  Musculoskeletal: Normal range of motion.  Skin: Skin is warm and dry.  Psychiatric: She has a normal mood and affect.     Assessment/Plan Acute appendicitis Admit IVF ABX OR in am per [facility] service.  Discussed with patient and husband They  Both understood and agreed with the treatment   CORNETT,THOMAS A. 12/22/2014, 10:35 PM

## 2014-12-22 NOTE — ED Notes (Signed)
Pt c/o RLQ pain x 2 days; pt appears in sever pain; pt speaks spanish

## 2014-12-22 NOTE — ED Provider Notes (Signed)
CSN: [id number]     Arrival date & time 12/22/14  1414 History   First MD Initiated Contact with Patient 12/22/14 1717     Chief Complaint  Patient presents with  . Abdominal Pain     (Consider location/radiation/quality/duration/timing/severity/associated sxs/prior Treatment) Patient is a 34 y.o. female presenting with abdominal pain.  Abdominal Pain Pain location:  RLQ Pain quality: gnawing and sharp   Pain radiates to:  Does not radiate Pain severity:  Severe Onset quality:  Sudden Timing:  Constant Progression:  Worsening Chronicity:  New Relieved by:  Nothing Associated symptoms: diarrhea, nausea and vomiting   Associated symptoms: no hematuria, no vaginal bleeding and no vaginal discharge     History reviewed. No pertinent past medical history. History reviewed. No pertinent past surgical history. History reviewed. No pertinent family history. Social History  Substance Use Topics  . Smoking status: Never Smoker   . Smokeless tobacco: None  . Alcohol Use: No   OB History    No data available     Review of Systems  Constitutional: Negative for activity change and appetite change.  Respiratory: Negative for wheezing.   Gastrointestinal: Positive for nausea, vomiting, abdominal pain and diarrhea.  Genitourinary: Negative for hematuria, vaginal bleeding and vaginal discharge.  Skin: Negative for color change.  All other systems reviewed and are negative.     Allergies  Review of patient's allergies indicates no known allergies.  Home Medications   Prior to Admission medications   Not on File   BP 93/43 mmHg  Pulse 77  Temp(Src) 98.3 F (36.8 C) (Oral)  Resp 18  SpO2 99% Physical Exam  Constitutional: She is oriented to person, place, and time. She appears well-developed and well-nourished. No distress.  HENT:  Head: Normocephalic.  Eyes: Pupils are equal, round, and reactive to light.  Neck: Normal range of motion.  Cardiovascular: Normal rate.    Pulmonary/Chest: Effort normal. No respiratory distress. She has no wheezes. She has no rales.  Abdominal: Soft. She exhibits no distension and no mass. There is tenderness. There is rebound. There is no guarding.  RLQ tenderness over McBurney  Negative Rosvings  Negative Obturator/Psoas  Musculoskeletal: Normal range of motion. She exhibits no edema.  Neurological: She is alert and oriented to person, place, and time. No cranial nerve deficit. Coordination normal.  Skin: Skin is warm and dry. She is not diaphoretic. No erythema.  Psychiatric: Her behavior is normal.  Nursing note and vitals reviewed.   ED Course  Procedures (including critical care time) Labs Review Labs Reviewed  LIPASE, BLOOD - Abnormal; Notable for the following:    Lipase 20 (*)    All other components within normal limits  COMPREHENSIVE METABOLIC PANEL - Abnormal; Notable for the following:    Potassium 3.2 (*)    Glucose, Bld 120 (*)    Calcium 8.4 (*)    Albumin 3.4 (*)    All other components within normal limits  CBC - Abnormal; Notable for the following:    WBC 19.9 (*)    Platelets 443 (*)    All other components within normal limits  URINALYSIS, ROUTINE W REFLEX MICROSCOPIC (NOT AT [facility]) - Abnormal; Notable for the following:    Color, Urine AMBER (*)    APPearance CLOUDY (*)    Hgb urine dipstick SMALL (*)    Ketones, ur 15 (*)    Protein, ur 30 (*)    Leukocytes, UA SMALL (*)    All other components within normal  limits  URINE MICROSCOPIC-ADD ON - Abnormal; Notable for the following:    Squamous Epithelial / LPF MANY (*)    Bacteria, UA FEW (*)    All other components within normal limits  I-STAT BETA HCG BLOOD, ED ([facility], [facility], [facility] ONLY)   Imaging Review Ct Abdomen Pelvis W Contrast  12/22/2014   CLINICAL DATA:  Two day history of right lower quadrant pain. One day history of vomiting  EXAM: CT ABDOMEN AND PELVIS WITH CONTRAST  TECHNIQUE: Multidetector CT imaging of the abdomen and pelvis  was performed using the standard protocol following bolus administration of intravenous contrast.  CONTRAST:  OMNIPAQUE IOHEXOL 300 MG/ML  SOLN  COMPARISON:  None.  FINDINGS: Lower chest:  Lung bases are clear.  Hepatobiliary: No focal liver lesions are identified. There is mild fatty infiltration near the fissure for the ligamentum teres. Gallbladder wall is not thickened. There is no biliary duct dilatation.  Pancreas: No mass or inflammatory focus.  Spleen: No focal lesion.  Adrenals/Urinary Tract: Adrenals appear normal bilaterally. Kidneys bilaterally show no mass or hydronephrosis on either side. There is no renal or ureteral calculus on either side. The urinary bladder is midline.  Stomach/Bowel: There is no bowel obstruction. No free air or portal venous air. No bowel wall or mesenteric thickening.  Vascular/Lymphatic: No abdominal aortic aneurysm. Major vessels appear widely patent. There is no appreciable adenopathy.  Reproductive: Uterus is anteverted. There is a cyst in the left ovary measuring 3.5 x 2.2 cm. No other pelvic mass identified.  Other: There is distention of the appendix with enhancement of portions of the appendiceal wall. There is surrounding periappendiceal inflammation. There is no well-defined abscess. Elsewhere, there is no abscess or ascites in the abdomen or pelvis.  Musculoskeletal: No intramuscular lesions are appreciable. There are no blastic or lytic bone lesions.  IMPRESSION: Evidence of acute appendiceal inflammation.  No frank abscess.  There is a left ovarian cyst measuring 3.5 x 2.2 cm.  Mild fatty infiltration in the liver near the fissure for the ligamentum teres.  No renal or ureteral calculus.  No hydronephrosis.  Critical Value/emergent results were called by telephone at the time of interpretation on 12/22/2014 at 8:24 pm to Dr. Jeri Modena GADDY , who verbally acknowledged these results.   Electronically Signed   By: Bretta Bang III M.D.   On: 12/22/2014 20:25    I have personally reviewed and evaluated these images and lab results as part of my medical decision-making.   EKG Interpretation None      MDM   Patient presents with right upper right lower quadrant pain since yesterday evening. Patient states that she had ate some food and started feeling abdominal pain into her right lower quadrant. Patient also endorses nausea one episode of vomiting one episode of diarrhea yesterday. She denies any fevers but endorses chills. She has no changes in her urination no vaginal bleeding of vaginal discharge no history of STDs.  Patient found to have leukocytosis and abdominal exam is significant for point tenderness at McBurney's point. No CVA tenderness. Slight blood in urine.  CT consistent with appendicitis. Surgery consulted. Patient given Cefoxitin and NS bolus. Patient admitted to surgery for further management.   Final diagnoses:  Acute appendicitis with localized peritonitis  Leukocytosis      Deirdre Peer, MD 12/23/14 1610  Courteney Lyn Mackuen, MD 12/24/14 1450

## 2014-12-22 NOTE — ED Notes (Signed)
ED resident at bedside.

## 2014-12-23 ENCOUNTER — Encounter (HOSPITAL_COMMUNITY): Payer: Self-pay | Admitting: Certified Registered"

## 2014-12-23 ENCOUNTER — Inpatient Hospital Stay (HOSPITAL_COMMUNITY): Payer: 59 | Admitting: Anesthesiology

## 2014-12-23 ENCOUNTER — Encounter (HOSPITAL_COMMUNITY): Admission: EM | Disposition: A | Discharge status: Home / Self Care | Payer: Self-pay | Source: Home / Self Care

## 2014-12-23 HISTORY — PX: LAPAROSCOPIC APPENDECTOMY: SHX408

## 2014-12-23 LAB — COMPREHENSIVE METABOLIC PANEL
ALK PHOS: 56 U/L (ref 38–126)
ALT: 14 U/L (ref 14–54)
ANION GAP: 8 (ref 5–15)
AST: 14 U/L — ABNORMAL LOW (ref 15–41)
Albumin: 2.5 g/dL — ABNORMAL LOW (ref 3.5–5.0)
BUN: <5 mg/dL — ABNORMAL LOW (ref 6–20)
CALCIUM: 7.4 mg/dL — AB (ref 8.9–10.3)
CO2: 22 mmol/L (ref 22–32)
CREATININE: 0.65 mg/dL (ref 0.44–1.00)
Chloride: 109 mmol/L (ref 101–111)
Glucose, Bld: 143 mg/dL — ABNORMAL HIGH (ref 65–99)
Potassium: 3.2 mmol/L — ABNORMAL LOW (ref 3.5–5.1)
Sodium: 139 mmol/L (ref 135–145)
TOTAL PROTEIN: 5.2 g/dL — AB (ref 6.5–8.1)
Total Bilirubin: 0.7 mg/dL (ref 0.3–1.2)

## 2014-12-23 LAB — CBC
HCT: 30.7 % — ABNORMAL LOW (ref 36.0–46.0)
HEMOGLOBIN: 10.2 g/dL — AB (ref 12.0–15.0)
MCH: 28.7 pg (ref 26.0–34.0)
MCHC: 33.2 g/dL (ref 30.0–36.0)
MCV: 86.2 fL (ref 78.0–100.0)
Platelets: 341 10*3/uL (ref 150–400)
RBC: 3.56 MIL/uL — AB (ref 3.87–5.11)
RDW: 13.9 % (ref 11.5–15.5)
WBC: 13.1 10*3/uL — ABNORMAL HIGH (ref 4.0–10.5)

## 2014-12-23 LAB — SURGICAL PCR SCREEN
MRSA, PCR: NEGATIVE
STAPHYLOCOCCUS AUREUS: NEGATIVE

## 2014-12-23 SURGERY — APPENDECTOMY, LAPAROSCOPIC
Anesthesia: General | Site: Abdomen

## 2014-12-23 MED ORDER — LIDOCAINE HCL (CARDIAC) 20 MG/ML IV SOLN
INTRAVENOUS | Status: AC
  Filled 2014-12-23: qty 5

## 2014-12-23 MED ORDER — PROPOFOL 10 MG/ML IV BOLUS
INTRAVENOUS | Status: AC
  Filled 2014-12-23: qty 20

## 2014-12-23 MED ORDER — KCL IN DEXTROSE-NACL 20-5-0.9 MEQ/L-%-% IV SOLN
INTRAVENOUS | Status: DC
  Administered 2014-12-24: 04:00:00 via INTRAVENOUS
  Filled 2014-12-23 (×5): qty 1000

## 2014-12-23 MED ORDER — ROCURONIUM BROMIDE 100 MG/10ML IV SOLN
INTRAVENOUS | Status: DC | PRN
  Administered 2014-12-23: 40 mg via INTRAVENOUS

## 2014-12-23 MED ORDER — ONDANSETRON HCL 4 MG/2ML IJ SOLN
INTRAMUSCULAR | Status: DC | PRN
  Administered 2014-12-23: 4 mg via INTRAVENOUS

## 2014-12-23 MED ORDER — 0.9 % SODIUM CHLORIDE (POUR BTL) OPTIME
TOPICAL | Status: DC | PRN
  Administered 2014-12-23: 1000 mL

## 2014-12-23 MED ORDER — MIDAZOLAM HCL 5 MG/5ML IJ SOLN
INTRAMUSCULAR | Status: DC | PRN
  Administered 2014-12-23: 2 mg via INTRAVENOUS

## 2014-12-23 MED ORDER — GLYCOPYRROLATE 0.2 MG/ML IJ SOLN
INTRAMUSCULAR | Status: DC | PRN
  Administered 2014-12-23: 0.6 mg via INTRAVENOUS

## 2014-12-23 MED ORDER — SODIUM CHLORIDE 0.9 % IR SOLN
Status: DC | PRN
  Administered 2014-12-23: 1000 mL

## 2014-12-23 MED ORDER — NEOSTIGMINE METHYLSULFATE 10 MG/10ML IV SOLN
INTRAVENOUS | Status: DC | PRN
  Administered 2014-12-23: 4 mg via INTRAVENOUS

## 2014-12-23 MED ORDER — PANTOPRAZOLE SODIUM 40 MG IV SOLR
40.0000 mg | Freq: Every day | INTRAVENOUS | Status: DC
  Administered 2014-12-23: 40 mg via INTRAVENOUS
  Filled 2014-12-23: qty 40

## 2014-12-23 MED ORDER — HYDROMORPHONE HCL 1 MG/ML IJ SOLN
INTRAMUSCULAR | Status: AC
  Administered 2014-12-23: 0.5 mg via INTRAVENOUS
  Filled 2014-12-23: qty 1

## 2014-12-23 MED ORDER — FENTANYL CITRATE (PF) 100 MCG/2ML IJ SOLN
INTRAMUSCULAR | Status: DC | PRN
  Administered 2014-12-23 (×2): 100 ug via INTRAVENOUS
  Administered 2014-12-23: 50 ug via INTRAVENOUS
  Administered 2014-12-23: 100 ug via INTRAVENOUS
  Administered 2014-12-23 (×3): 50 ug via INTRAVENOUS

## 2014-12-23 MED ORDER — ROCURONIUM BROMIDE 50 MG/5ML IV SOLN
INTRAVENOUS | Status: AC
  Filled 2014-12-23: qty 1

## 2014-12-23 MED ORDER — MIDAZOLAM HCL 2 MG/2ML IJ SOLN
INTRAMUSCULAR | Status: AC
  Filled 2014-12-23: qty 4

## 2014-12-23 MED ORDER — HYDROMORPHONE HCL 1 MG/ML IJ SOLN
0.2500 mg | INTRAMUSCULAR | Status: DC | PRN
  Administered 2014-12-23 (×2): 0.5 mg via INTRAVENOUS

## 2014-12-23 MED ORDER — PROPOFOL 10 MG/ML IV BOLUS
INTRAVENOUS | Status: DC | PRN
  Administered 2014-12-23: 110 mg via INTRAVENOUS

## 2014-12-23 MED ORDER — BUPIVACAINE-EPINEPHRINE (PF) 0.25% -1:200000 IJ SOLN
INTRAMUSCULAR | Status: AC
  Filled 2014-12-23: qty 30

## 2014-12-23 MED ORDER — ENOXAPARIN SODIUM 40 MG/0.4ML ~~LOC~~ SOLN
40.0000 mg | SUBCUTANEOUS | Status: DC
  Filled 2014-12-23: qty 0.4

## 2014-12-23 MED ORDER — ONDANSETRON HCL 4 MG/2ML IJ SOLN
INTRAMUSCULAR | Status: AC
  Filled 2014-12-23: qty 2

## 2014-12-23 MED ORDER — FENTANYL CITRATE (PF) 250 MCG/5ML IJ SOLN
INTRAMUSCULAR | Status: AC
  Filled 2014-12-23: qty 5

## 2014-12-23 MED ORDER — LACTATED RINGERS IV SOLN
INTRAVENOUS | Status: DC
  Administered 2014-12-23: 10:00:00 via INTRAVENOUS

## 2014-12-23 MED ORDER — LIDOCAINE HCL (CARDIAC) 20 MG/ML IV SOLN
INTRAVENOUS | Status: DC | PRN
  Administered 2014-12-23: 60 mg via INTRAVENOUS

## 2014-12-23 MED ORDER — HYDROCODONE-ACETAMINOPHEN 5-325 MG PO TABS
1.0000 | ORAL_TABLET | ORAL | Status: DC | PRN
  Administered 2014-12-23 – 2014-12-24 (×3): 1 via ORAL
  Filled 2014-12-23 (×3): qty 1

## 2014-12-23 MED ORDER — BUPIVACAINE-EPINEPHRINE 0.25% -1:200000 IJ SOLN
INTRAMUSCULAR | Status: DC | PRN
  Administered 2014-12-23: 10 mL

## 2014-12-23 MED ORDER — SUCCINYLCHOLINE CHLORIDE 20 MG/ML IJ SOLN
INTRAMUSCULAR | Status: DC | PRN
  Administered 2014-12-23: 80 mg via INTRAVENOUS

## 2014-12-23 MED ORDER — HYDROMORPHONE HCL 1 MG/ML IJ SOLN
1.0000 mg | INTRAMUSCULAR | Status: DC | PRN
  Administered 2014-12-24: 1 mg via INTRAVENOUS
  Filled 2014-12-23: qty 1

## 2014-12-23 SURGICAL SUPPLY — 56 items
ADH SKN CLS APL DERMABOND .7 (GAUZE/BANDAGES/DRESSINGS) ×1
APPLIER CLIP ROT 10 11.4 M/L (STAPLE)
APR CLP MED LRG 11.4X10 (STAPLE)
BAG SPEC RTRVL LRG 6X4 10 (ENDOMECHANICALS) ×1
BLADE SURG ROTATE 9660 (MISCELLANEOUS) IMPLANT
CANISTER SUCTION 2500CC (MISCELLANEOUS) ×3 IMPLANT
CHLORAPREP W/TINT 26ML (MISCELLANEOUS) ×3 IMPLANT
CLIP APPLIE ROT 10 11.4 M/L (STAPLE) IMPLANT
CLOSURE WOUND 1/2 X4 (GAUZE/BANDAGES/DRESSINGS) ×1
COVER SURGICAL LIGHT HANDLE (MISCELLANEOUS) ×3 IMPLANT
CUTTER LINEAR ENDO 35 ART FLEX (STAPLE) ×2 IMPLANT
CUTTER LINEAR ENDO 35 ETS (STAPLE) IMPLANT
CUTTER LINEAR ENDO 35 ETS TH (STAPLE) IMPLANT
DERMABOND ADVANCED (GAUZE/BANDAGES/DRESSINGS) ×2
DERMABOND ADVANCED .7 DNX12 (GAUZE/BANDAGES/DRESSINGS) ×1 IMPLANT
DRSG TEGADERM 2-3/8X2-3/4 SM (GAUZE/BANDAGES/DRESSINGS) ×6 IMPLANT
ELECT REM PT RETURN 9FT ADLT (ELECTROSURGICAL) ×3
ELECTRODE REM PT RTRN 9FT ADLT (ELECTROSURGICAL) ×1 IMPLANT
ENDOLOOP SUT PDS II  0 18 (SUTURE)
ENDOLOOP SUT PDS II 0 18 (SUTURE) IMPLANT
GLOVE BIO SURGEON STRL SZ 6.5 (GLOVE) ×1 IMPLANT
GLOVE BIO SURGEONS STRL SZ 6.5 (GLOVE) ×1
GLOVE BIOGEL PI IND STRL 6.5 (GLOVE) IMPLANT
GLOVE BIOGEL PI IND STRL 7.0 (GLOVE) IMPLANT
GLOVE BIOGEL PI IND STRL 7.5 (GLOVE) IMPLANT
GLOVE BIOGEL PI IND STRL 8 (GLOVE) ×1 IMPLANT
GLOVE BIOGEL PI INDICATOR 6.5 (GLOVE) ×4
GLOVE BIOGEL PI INDICATOR 7.0 (GLOVE) ×4
GLOVE BIOGEL PI INDICATOR 7.5 (GLOVE) ×2
GLOVE BIOGEL PI INDICATOR 8 (GLOVE) ×2
GLOVE ECLIPSE 6.5 STRL STRAW (GLOVE) ×2 IMPLANT
GLOVE ECLIPSE 7.5 STRL STRAW (GLOVE) ×3 IMPLANT
GLOVE SURG SS PI 7.0 STRL IVOR (GLOVE) ×4 IMPLANT
GOWN STRL REUS W/ TWL LRG LVL3 (GOWN DISPOSABLE) ×3 IMPLANT
GOWN STRL REUS W/TWL LRG LVL3 (GOWN DISPOSABLE) ×15
KIT BASIN OR (CUSTOM PROCEDURE TRAY) ×3 IMPLANT
KIT ROOM TURNOVER OR (KITS) ×3 IMPLANT
NS IRRIG 1000ML POUR BTL (IV SOLUTION) ×3 IMPLANT
PAD ARMBOARD 7.5X6 YLW CONV (MISCELLANEOUS) ×6 IMPLANT
PENCIL BUTTON HOLSTER BLD 10FT (ELECTRODE) IMPLANT
POUCH SPECIMEN RETRIEVAL 10MM (ENDOMECHANICALS) ×3 IMPLANT
RELOAD /EVU35 (ENDOMECHANICALS) IMPLANT
RELOAD CUTTER ETS 35MM STAND (ENDOMECHANICALS) IMPLANT
SCALPEL HARMONIC ACE (MISCELLANEOUS) ×3 IMPLANT
SET IRRIG TUBING LAPAROSCOPIC (IRRIGATION / IRRIGATOR) ×3 IMPLANT
SLEEVE ENDOPATH XCEL 5M (ENDOMECHANICALS) ×3 IMPLANT
SPECIMEN JAR SMALL (MISCELLANEOUS) ×3 IMPLANT
STRIP CLOSURE SKIN 1/2X4 (GAUZE/BANDAGES/DRESSINGS) ×1 IMPLANT
SUT MNCRL AB 4-0 PS2 18 (SUTURE) ×3 IMPLANT
TOWEL OR 17X24 6PK STRL BLUE (TOWEL DISPOSABLE) ×1 IMPLANT
TOWEL OR 17X26 10 PK STRL BLUE (TOWEL DISPOSABLE) ×3 IMPLANT
TRAY FOLEY CATH 16FR SILVER (SET/KITS/TRAYS/PACK) ×3 IMPLANT
TRAY LAPAROSCOPIC MC (CUSTOM PROCEDURE TRAY) ×3 IMPLANT
TROCAR XCEL BLUNT TIP 100MML (ENDOMECHANICALS) ×3 IMPLANT
TROCAR XCEL NON-BLD 5MMX100MML (ENDOMECHANICALS) ×3 IMPLANT
TUBING INSUFFLATION (TUBING) ×3 IMPLANT

## 2014-12-23 NOTE — Anesthesia Procedure Notes (Signed)
Procedure Name: Intubation Date/Time: 12/23/2014 11:23 AM Performed by: Jefm Miles E Pre-anesthesia Checklist: Patient identified, Emergency Drugs available, Suction available, Patient being monitored and Timeout performed Patient Re-evaluated:Patient Re-evaluated prior to inductionOxygen Delivery Method: Circle system utilized and Ambu bag Preoxygenation: Pre-oxygenation with 100% oxygen Intubation Type: IV induction, Rapid sequence and Cricoid Pressure applied Laryngoscope Size: Mac and 3 Grade View: Grade I Tube type: Oral Tube size: 7.0 mm Number of attempts: 1 Airway Equipment and Method: Stylet Placement Confirmation: ETT inserted through vocal cords under direct vision,  positive ETCO2 and breath sounds checked- equal and bilateral Secured at: 20 cm Tube secured with: Tape Dental Injury: Teeth and Oropharynx as per pre-operative assessment

## 2014-12-23 NOTE — Op Note (Signed)
OPERATIVE REPORT  DATE OF OPERATION:  12/23/2014  PATIENT:  [name] [name]  34 y.o. female  PRE-OPERATIVE DIAGNOSIS:  Acute appendicitis  POST-OPERATIVE DIAGNOSIS:  Acute appendicitis  PROCEDURE:  Procedure(s): APPENDECTOMY LAPAROSCOPIC  SURGEON:  Surgeon(s): Jimmye Norman, MD  ASSISTANT: Fransisco Hertz, PA-S  ANESTHESIA:   general  EBL: <30 ml  BLOOD ADMINISTERED: none  DRAINS: none   SPECIMEN:  Source of Specimen:  Appendix  COUNTS CORRECT:  YES  PROCEDURE DETAILS: The patient was taken to the operating room and placed on the table in supine position. After an adequate general endotracheal anesthesia was administered she was prepped and draped in the usual sterile manner exposing her entire abdomen.  A proper timeout was performed identifying the patient and the procedure to be performed. A supraumbilical midline incision was made using a #15 blade and taken down to the midline fascia. The fascia was incised with a 15 blade then we bluntly dissected into the peritoneal cavity. A pursestring suture of 0 Vicryl was passed around the fascial opening then a Hassan cannula pass into the peritoneal cavity through which carbon dioxide gas was insufflated up to a maximal intra-abdominal pressure of 15 mmHg.  Subsequently a right upper quadrant 5 mm cannula and a left low quadrant 5 mm cannula pass under direct vision.  The patient cecum and appendix were tethered to the right lower quadrant and retroperitoneum by inflammatory adhesions from the acutely inflamed suppurative appendicitis. The appendix was also matted against the wall of the cecum and the terminal ileum by inflammatory adhesions. These were taken down bluntly and sharply using a Harmonic Scalpel and also blunt dissection with the instruments. We were able to mobilize the mesoappendix and come across with the harmonic scalpel isolating the appendix at the base of the cecum. Once this was adequately performed we came across the  base of the appendix using a blue cartridge Endo GIA stapler. The appendix was detached and retrieved from the umbilical site cannula site using an EndoCatch bag.  There was adequate hemostasis in right lower quadrant and no evidence of an abscess cavity. We irrigated with a liter saline solution then aspirated all fluid and gas from the abdominal cavity as we closed off the fascial site with the pursestring suture which was in place.  0.25% Marcaine was injected at all sites. The super umbilical skin site was closed using a running subcuticular stitch of 4-0 Monocryl. We subsequently closed with Tegaderm tape strips and Dermabond. All needle counts, sponge counts, and instrument counts were correct.  PATIENT DISPOSITION:  PACU - hemodynamically stable.   JAMES WYATT 9/28/20161:22 PM

## 2014-12-23 NOTE — Progress Notes (Signed)
Patient on antibiotics.  Afebrile.  Still tender in the RLQ.  Will go to OR for laparoscopic appendectomy this AM.  [name] [name]. Gae Bon, MD, FACS [phone] [facility]

## 2014-12-23 NOTE — Transfer of Care (Signed)
Immediate Anesthesia Transfer of Care Note  Patient: [name] [name]  Procedure(s) Performed: Procedure(s): APPENDECTOMY LAPAROSCOPIC (N/A)  Patient Location: PACU  Anesthesia Type:General  Level of Consciousness: awake, alert  and oriented  Airway & Oxygen Therapy: Patient Spontanous Breathing  Post-op Assessment: Report given to RN  Post vital signs: Reviewed and stable  Last Vitals:  Filed Vitals:   12/23/14 0921  BP: 108/52  Pulse: 74  Temp: 36.9 C  Resp:     Complications: No apparent anesthesia complications

## 2014-12-23 NOTE — Progress Notes (Signed)
Interpreter Graciela Namihira for pre surgery short stay.  °

## 2014-12-23 NOTE — Anesthesia Postprocedure Evaluation (Signed)
  Anesthesia Post-op Note  Patient: [name] [name]  Procedure(s) Performed: Procedure(s): APPENDECTOMY LAPAROSCOPIC (N/A)  Patient Location: PACU  Anesthesia Type:General  Level of Consciousness: awake and alert   Airway and Oxygen Therapy: Patient Spontanous Breathing  Post-op Pain: Controlled  Post-op Assessment: Post-op Vital signs reviewed, Patient's Cardiovascular Status Stable and Respiratory Function Stable  Post-op Vital Signs: Reviewed  Filed Vitals:   12/23/14 1325  BP: 110/58  Pulse: 80  Temp:   Resp: 20    Complications: No apparent anesthesia complications

## 2014-12-23 NOTE — Anesthesia Preprocedure Evaluation (Addendum)
Anesthesia Evaluation  Patient identified by MRN, date of birth, ID band Patient awake    Reviewed: Allergy & Precautions, H&P , NPO status , Patient's Chart, lab work & pertinent test results  Airway Mallampati: III  TM Distance: >3 FB Neck ROM: Full    Dental no notable dental hx. (+) Teeth Intact, Dental Advisory Given   Pulmonary neg pulmonary ROS,    Pulmonary exam normal breath sounds clear to auscultation       Cardiovascular negative cardio ROS   Rhythm:Regular Rate:Normal     Neuro/Psych negative neurological ROS  negative psych ROS   GI/Hepatic negative GI ROS, Neg liver ROS,   Endo/Other  negative endocrine ROS  Renal/GU negative Renal ROS  negative genitourinary   Musculoskeletal   Abdominal   Peds  Hematology negative hematology ROS (+)   Anesthesia Other Findings   Reproductive/Obstetrics negative OB ROS                             Anesthesia Physical Anesthesia Plan  ASA: II  Anesthesia Plan: General   Post-op Pain Management:    Induction: Intravenous, Rapid sequence and Cricoid pressure planned  Airway Management Planned: Oral ETT  Additional Equipment:   Intra-op Plan:   Post-operative Plan: Extubation in OR  Informed Consent: I have reviewed the patients History and Physical, chart, labs and discussed the procedure including the risks, benefits and alternatives for the proposed anesthesia with the patient or authorized representative who has indicated his/her understanding and acceptance.   Dental advisory given  Plan Discussed with: CRNA  Anesthesia Plan Comments:         Anesthesia Quick Evaluation  

## 2014-12-24 ENCOUNTER — Encounter (HOSPITAL_COMMUNITY): Payer: Self-pay | Admitting: General Surgery

## 2014-12-24 LAB — CBC
HEMATOCRIT: 30.2 % — AB (ref 36.0–46.0)
HEMOGLOBIN: 9.9 g/dL — AB (ref 12.0–15.0)
MCH: 28.6 pg (ref 26.0–34.0)
MCHC: 32.8 g/dL (ref 30.0–36.0)
MCV: 87.3 fL (ref 78.0–100.0)
Platelets: 327 10*3/uL (ref 150–400)
RBC: 3.46 MIL/uL — AB (ref 3.87–5.11)
RDW: 14.1 % (ref 11.5–15.5)
WBC: 12.6 10*3/uL — ABNORMAL HIGH (ref 4.0–10.5)

## 2014-12-24 MED ORDER — HYDROCODONE-ACETAMINOPHEN 5-325 MG PO TABS: 1.0000 | ORAL_TABLET | ORAL | Status: AC | PRN

## 2014-12-24 NOTE — Discharge Instructions (Signed)
LAPAROSCOPIC SURGERY: POST OP INSTRUCTIONS ° °1. DIET: Follow a light bland diet the first 24 hours after arrival home, such as soup, liquids, crackers, etc.  Be sure to include lots of fluids daily.  Avoid fast food or heavy meals as your are more likely to get nauseated.  Eat a low fat the next few days after surgery.   °2. Take your usually prescribed home medications unless otherwise directed. °3. PAIN CONTROL: °a. Pain is best controlled by a usual combination of three different methods TOGETHER: °i. Ice/Heat °ii. Over the counter pain medication °iii. Prescription pain medication °b. Most patients will experience some swelling and bruising around the incisions.  Ice packs or heating pads (30-60 minutes up to 6 times a day) will help. Use ice for the first few days to help decrease swelling and bruising, then switch to heat to help relax tight/sore spots and speed recovery.  Some people prefer to use ice alone, heat alone, alternating between ice & heat.  Experiment to what works for you.  Swelling and bruising can take several weeks to resolve.   °c. It is helpful to take an over-the-counter pain medication regularly for the first few weeks.  Choose one of the following that works best for you: °i. Naproxen (Aleve, etc)  Two 220mg tabs twice a day °ii. Ibuprofen (Advil, etc) Three 200mg tabs four times a day (every meal & bedtime) °iii. Acetaminophen (Tylenol, etc) 500-650mg four times a day (every meal & bedtime) °d. A  prescription for pain medication (such as oxycodone, hydrocodone, etc) should be given to you upon discharge.  Take your pain medication as prescribed.  °i. If you are having problems/concerns with the prescription medicine (does not control pain, nausea, vomiting, rash, itching, etc), please call us [phone] to see if we need to switch you to a different pain medicine that will work better for you and/or control your side effect better. °ii. If you need a refill on your pain medication,  please contact your pharmacy.  They will contact our office to request authorization. Prescriptions will not be filled after 5 pm or on week-ends. °4. Avoid getting constipated.  Between the surgery and the pain medications, it is common to experience some constipation.  Increasing fluid intake and taking a fiber supplement (such as Metamucil, Citrucel, FiberCon, MiraLax, etc) 1-2 times a day regularly will usually help prevent this problem from occurring.  A mild laxative (prune juice, Milk of Magnesia, MiraLax, etc) should be taken according to package directions if there are no bowel movements after 48 hours.   °5. Watch out for diarrhea.  If you have many loose bowel movements, simplify your diet to bland foods & liquids for a few days.  Stop any stool softeners and decrease your fiber supplement.  Switching to mild anti-diarrheal medications (Kayopectate, Pepto Bismol) can help.  If this worsens or does not improve, please call us. °6. Wash / shower every day.  You may shower over the dressings as they are waterproof.  Continue to shower over incision(s) after the dressing is off. °7. Remove your waterproof bandages 5 days after surgery.  You may leave the incision open to air.  You may replace a dressing/Band-Aid to cover the incision for comfort if you wish.  °8. ACTIVITIES as tolerated:   °a. You may resume regular (light) daily activities beginning the next day--such as daily self-care, walking, climbing stairs--gradually increasing activities as tolerated.  If you can walk 30 minutes without difficulty, it   is safe to try more intense activity such as jogging, treadmill, bicycling, low-impact aerobics, swimming, etc. b. Save the most intensive and strenuous activity for last such as sit-ups, heavy lifting, contact sports, etc  Refrain from any heavy lifting or straining until you are off narcotics for pain control.   c. DO NOT PUSH THROUGH PAIN.  Let pain be your guide: If it hurts to do something, don't  do it.  Pain is your body warning you to avoid that activity for another week until the pain goes down. d. You may drive when you are no longer taking prescription pain medication, you can comfortably wear a seatbelt, and you can safely maneuver your car and apply brakes. e. [name] [name] may have sexual intercourse when it is comfortable.  9. FOLLOW UP in our office a. Please call [facility] at [phone] to set up an appointment to see your surgeon in the office for a follow-up appointment approximately 2-3 weeks after your surgery. b. Make sure that you call for this appointment the day you arrive home to insure a convenient appointment time. 10. IF YOU HAVE DISABILITY OR FAMILY LEAVE FORMS, BRING THEM TO THE OFFICE FOR PROCESSING.  DO NOT GIVE THEM TO YOUR DOCTOR.   WHEN TO CALL us [phone]: 1. Poor pain control 2. Reactions / problems with new medications (rash/itching, nausea, etc)  3. Fever over 101.5 F (38.5 C) 4. Inability to urinate 5. Nausea and/or vomiting 6. Worsening swelling or bruising 7. Continued bleeding from incision. 8. Increased pain, redness, or drainage from the incision   The clinic staff is available to answer your questions during regular business hours (8:30am-5pm).  Please dont hesitate to call and ask to speak to one of our nurses for clinical concerns.   If you have a medical emergency, go to the nearest emergency room or call 911.  A surgeon from [facility] is always on call at the [facility], [place] [address], [place], [place]  [address] ? MAIN: [phone] ? TOLL FREE: [phone] ?  FAX [phone] [email or web address]  Apendicectoma laparoscpica, Cuidados posteriores (Laparoscopic Appendectomy, Care After) Por favor, lea estas instrucciones y consltelas en las prximas semanas. Estas indicaciones le proporcionan informacin general acerca de cmo deber cuidarse despus de dejar  el hospital. El mdico podr darle instrucciones especficas. Aunque el tratamiento se ha planificado de acuerdo con las prcticas mdicas disponibles ms recientes, ocasionalmente pueden ocurrir complicaciones inevitables. Si tiene problemas o surgen preguntas luego de recibir el alta, por favor comunquese con su mdico. INSTRUCCIONES PARA EL CUIDADO DOMICILIARIO  No conduzca mientras toma medicamentos narcticos prescriptos para Chief Technology Officer.  Si estos medicamentos lo constipan, use un laxante.  Cambie el vendaje tal como se le indic.  Mantenga la herida limpia y seca. Lave suavemente la herida con agua y [place]. Seque suavemente con pequeos golpecitos, sin frotar.  No tome baos, no utilice piscinas ni baeras durante [address], o segn las indicaciones del mdico.  Solo tome medicamentos que se pueden comprar sin receta o recetados para Chief Technology Officer, Dentist o fiebre, como le indica el mdico.  Puede continuar con su dieta normal segn se le haya indicado.  No levante objetos pesados (ms de 5 kg [10 lb] ni realice deportes de contacto durante 3 semanas, o segn las indicaciones.  Despus de la operacin, podr aumentar la actividad de a poco.  Respire profundamente para evitar complicaciones del postoperatorio como neumona. SOLICITE ATENCIN  MDICA SI:  Presenta enrojecimiento, hinchazn o aumento del dolor en la herida.  Observa pus en la zona de la herida.  Hay un drenaje en la herida que dura ms de Civil engineer, contracting.  Advierte un olor ftido que proviene de la herida o del vendaje.  La herida se abre (los bordes no estn unidos) luego de la remocin de las suturas.  Nota un incremento del dolor en los hombros (en la zona donde van los breteles) o cerca de los omplatos.  Presenta episodios de mareos o se siente dbil cuando est de pie.  Le falta el aire.  Presenta nuseas o vmitos persistentes.  No puede mover el vientre o tiene intolerancia a los alimentos.  Tiene  diarrea. SOLICITE ATENCIN MDICA INMEDIATAMENTE SI:  Tiene fiebre.  Aparece una erupcin cutnea.  Tiene dificultad ara respirar o siente un dolor agudo en el pecho.  Aparece alguna reaccin o efecto secundario por los medicamentos administrados. ASEGRESE DE QUE:   Comprende estas instrucciones.  Controlar su enfermedad.  Solicitar ayuda de inmediato si no mejora o si empeora. Document Released: 04/02/2007 Document Revised: 06/05/2011 [facility] Patient Information 2015 [place], [place]. This information is not intended to replace advice given to you by your health care provider. Make sure you discuss any questions you have with your health care provider.

## 2014-12-24 NOTE — Discharge Summary (Signed)
Physician Discharge Summary  Patient ID: [name] [name] MRN: [id number] DOB/AGE: Nov 26, 1980 34 y.o.  Admit date: 12/22/2014 Discharge date: 12/24/2014  Admitting Diagnosis: Acute appendicitis  Discharge Diagnosis Patient Active Problem List   Diagnosis Date Noted  . Appendicitis 12/22/2014  . Acute appendicitis 12/22/2014    Consultants none  Imaging: Ct Abdomen Pelvis W Contrast  12/22/2014   CLINICAL DATA:  Two day history of right lower quadrant pain. One day history of vomiting  EXAM: CT ABDOMEN AND PELVIS WITH CONTRAST  TECHNIQUE: Multidetector CT imaging of the abdomen and pelvis was performed using the standard protocol following bolus administration of intravenous contrast.  CONTRAST:  OMNIPAQUE IOHEXOL 300 MG/ML  SOLN  COMPARISON:  None.  FINDINGS: Lower chest:  Lung bases are clear.  Hepatobiliary: No focal liver lesions are identified. There is mild fatty infiltration near the fissure for the ligamentum teres. Gallbladder wall is not thickened. There is no biliary duct dilatation.  Pancreas: No mass or inflammatory focus.  Spleen: No focal lesion.  Adrenals/Urinary Tract: Adrenals appear normal bilaterally. Kidneys bilaterally show no mass or hydronephrosis on either side. There is no renal or ureteral calculus on either side. The urinary bladder is midline.  Stomach/Bowel: There is no bowel obstruction. No free air or portal venous air. No bowel wall or mesenteric thickening.  Vascular/Lymphatic: No abdominal aortic aneurysm. Major vessels appear widely patent. There is no appreciable adenopathy.  Reproductive: Uterus is anteverted. There is a cyst in the left ovary measuring 3.5 x 2.2 cm. No other pelvic mass identified.  Other: There is distention of the appendix with enhancement of portions of the appendiceal wall. There is surrounding periappendiceal inflammation. There is no well-defined abscess. Elsewhere, there is no abscess or ascites in the abdomen or pelvis.   Musculoskeletal: No intramuscular lesions are appreciable. There are no blastic or lytic bone lesions.  IMPRESSION: Evidence of acute appendiceal inflammation.  No frank abscess.  There is a left ovarian cyst measuring 3.5 x 2.2 cm.  Mild fatty infiltration in the liver near the fissure for the ligamentum teres.  No renal or ureteral calculus.  No hydronephrosis.  Critical Value/emergent results were called by telephone at the time of interpretation on 12/22/2014 at 8:24 pm to Dr. Jeri Modena GADDY , who verbally acknowledged these results.   Electronically Signed   By: Bretta Bang III M.D.   On: 12/22/2014 20:25    Procedures Laparoscopic appendectomy---Dr. Rexene Edison Course:  [name] [name] presented to [facility] with abdominal pain.  Workup showed acute appendicitis.  Patient was admitted and underwent procedure listed above.  Tolerated procedure well and was transferred to the floor.  Diet was advanced as tolerated.  On POD#1, the patient was voiding well, tolerating diet, ambulating well, pain well controlled, vital signs stable, incisions c/d/i and felt stable for discharge home.  Medication risks, benefits and therapeutic alternatives were reviewed with the patient.  She verbalizes understanding.  Patient will follow up in our office in 3 weeks and knows to call with questions or concerns.  Physical Exam: General:  Alert, NAD, pleasant, comfortable Abd:  Soft, ND, mild tenderness, incisions C/D/I  We communicated via pacific interpreter.  All questions answered.      Medication List    TAKE these medications        HYDROcodone-acetaminophen 5-325 MG tablet  Commonly known as:  NORCO/VICODIN  Take 1-2 tablets by mouth every 4 (four) hours as needed for moderate pain.  Follow-up Information    Follow up with [facility] On 01/12/2015.   Specialty:  General Surgery   Why:  arrive by 1:30PM for a 2PM post operative check up   Contact information:    [address] [place] [address] [phone]       Signed: Ashok Norris, [facility] [phone]  12/24/2014, 9:35 AM

## 2016-01-25 ENCOUNTER — Encounter (HOSPITAL_COMMUNITY): Payer: Self-pay | Admitting: Family Medicine

## 2019-03-10 ENCOUNTER — Other Ambulatory Visit: Payer: Self-pay

## 2019-03-10 DIAGNOSIS — Z20822 Contact with and (suspected) exposure to covid-19: Secondary | ICD-10-CM

## 2019-03-11 LAB — NOVEL CORONAVIRUS, NAA: SARS-CoV-2, NAA: NOT DETECTED

## 2019-03-12 ENCOUNTER — Telehealth: Payer: Self-pay

## 2019-03-12 NOTE — Telephone Encounter (Signed)
Caller given negative result and verbalized understanding  

## 2020-05-11 ENCOUNTER — Encounter: Payer: 59 | Admitting: Family

## 2020-05-11 NOTE — Progress Notes (Signed)
Patient did not show for appointment.   

## 2021-02-03 ENCOUNTER — Ambulatory Visit: Payer: Self-pay | Attending: Family Medicine | Admitting: Family Medicine

## 2021-02-03 ENCOUNTER — Encounter: Payer: Self-pay | Admitting: Family Medicine

## 2021-02-03 ENCOUNTER — Other Ambulatory Visit: Payer: Self-pay

## 2021-02-03 VITALS — BP 117/79 | HR 69 | Ht 60.0 in | Wt 175.4 lb

## 2021-02-03 DIAGNOSIS — Z13228 Encounter for screening for other metabolic disorders: Secondary | ICD-10-CM

## 2021-02-03 DIAGNOSIS — Z1159 Encounter for screening for other viral diseases: Secondary | ICD-10-CM

## 2021-02-03 DIAGNOSIS — E65 Localized adiposity: Secondary | ICD-10-CM

## 2021-02-03 DIAGNOSIS — S139XXA Sprain of joints and ligaments of unspecified parts of neck, initial encounter: Secondary | ICD-10-CM

## 2021-02-03 MED ORDER — GABAPENTIN 300 MG PO CAPS: 300.0000 mg | ORAL_CAPSULE | Freq: Every day | ORAL | 3 refills | Status: AC

## 2021-02-03 MED ORDER — TIZANIDINE HCL 4 MG PO TABS: 4.0000 mg | ORAL_TABLET | Freq: Three times a day (TID) | ORAL | 2 refills | Status: AC | PRN

## 2021-02-03 NOTE — Progress Notes (Signed)
Subjective:  Patient ID: [name] [name], female    DOB: March 26, 1981  Age: 40 y.o. MRN: [id number]  CC: New Patient (Initial Visit)   HPI Jahmia Berrett Salena Ortlieb is a 40 y.o. year old female who presents today to establish care.  History is significant for appendectomy in 11/2014. She presents today with an in person Spanish interpreter.   Interval History: Today she reports doing well. She would like to have labs done  Complains of R shoulder pain x3 months with starts from her neck,  radiating down the arm intermittently but of recent has become persistent. She sometimes has numbness at night. She is R hand dominant. Also has a lump in her left underarm noticed 4 weeks ago, increasing in size, not painful.  Past Medical History:  Diagnosis Date   Depression    pp depression   Frequent UTI    GERD (gastroesophageal reflux disease)    History of genital warts     Past Surgical History:  Procedure Laterality Date   CESAREAN SECTION N/A 03/17/2014   Procedure: CESAREAN SECTION;  Surgeon: Truett Mainland, DO;  Location: [facility];  Service: Obstetrics;  Laterality: N/A;   CESAREAN SECTION     LAPAROSCOPIC APPENDECTOMY N/A 12/23/2014   Procedure: APPENDECTOMY LAPAROSCOPIC;  Surgeon: Judeth Horn, MD;  Location: [facility];  Service: General;  Laterality: N/A;    Family History  Problem Relation Age of Onset   Hypertension Mother    Diabetes Mother    Urinary tract infection Mother    Urinary tract infection Sister    Heart murmur Son     No Known Allergies  Outpatient Medications Prior to Visit  Medication Sig Dispense Refill   acetaminophen (TYLENOL) 325 MG tablet Take 650 mg by mouth every 6 (six) hours as needed.     HYDROcodone-acetaminophen (NORCO/VICODIN) 5-325 MG tablet Take 1-2 tablets by mouth every 4 (four) hours as needed for moderate pain. 40 tablet 0   lactulose (CHRONULAC) 10 GM/15ML solution Take 30 mLs (20 g total) by mouth 2 (two) times daily as needed  for mild constipation or moderate constipation. (Patient not taking: Reported on 02/03/2021) 240 mL 3   oxyCODONE-acetaminophen (PERCOCET/ROXICET) 5-325 MG per tablet Take 1 tablet by mouth every 4 (four) hours as needed (for pain scale less than 7). (Patient not taking: Reported on 02/03/2021) 50 tablet 0   Prenatal Vit-Fe Fumarate-FA (PRENATAL MULTIVITAMIN) TABS tablet Take 1 tablet by mouth daily at 12 noon. (Patient not taking: Reported on 02/03/2021)     No facility-administered medications prior to visit.     ROS Review of Systems  Constitutional:  Negative for activity change, appetite change and fatigue.  HENT:  Negative for congestion, sinus pressure and sore throat.   Eyes:  Negative for visual disturbance.  Respiratory:  Negative for cough, chest tightness, shortness of breath and wheezing.   Cardiovascular:  Negative for chest pain and palpitations.  Gastrointestinal:  Negative for abdominal distention, abdominal pain and constipation.  Endocrine: Negative for polydipsia.  Genitourinary:  Negative for dysuria and frequency.  Musculoskeletal:        See HPI  Skin:  Negative for rash.  Neurological:  Negative for tremors, light-headedness and numbness.  Hematological:  Does not bruise/bleed easily.  Psychiatric/Behavioral:  Negative for agitation and behavioral problems.    Objective:  BP 117/79   Pulse 69   Ht 5' (1.524 m)   Wt 175 lb 6.4 oz (79.6 kg)   SpO2 99%  BMI 34.26 kg/m   BP/Weight 02/03/2021 12/24/2014 33/38/3291  Systolic BP 916 606 004  Diastolic BP 79 71 44  Wt. (Lbs) 175.4 180 -  BMI 34.26 35.15 -      Physical Exam Constitutional:      Appearance: She is well-developed.  Cardiovascular:     Rate and Rhythm: Normal rate.     Heart sounds: Normal heart sounds. No murmur heard. Pulmonary:     Effort: Pulmonary effort is normal.     Breath sounds: Normal breath sounds. No wheezing or rales.  Chest:     Chest wall: No tenderness.  Abdominal:      General: Bowel sounds are normal. There is no distension.     Palpations: Abdomen is soft. There is no mass.     Tenderness: There is no abdominal tenderness.  Musculoskeletal:        General: Normal range of motion.     Right lower leg: No edema.     Left lower leg: No edema.     Comments: Slight increase in underarm adiposity in right compared to left but no obvious mass or tenderness Negative Neer and Hawkins signs No tenderness with palpation of shoulder joints bilaterally Normal handgrip bilaterally  Neurological:     Mental Status: She is alert and oriented to person, place, and time.  Psychiatric:        Mood and Affect: Mood normal.    CMP Latest Ref Rng & Units 12/23/2014 12/22/2014  Glucose 65 - 99 mg/dL 143(H) 120(H)  BUN 6 - 20 mg/dL <5(L) 6  Creatinine 0.44 - 1.00 mg/dL 0.65 0.63  Sodium 135 - 145 mmol/L 139 137  Potassium 3.5 - 5.1 mmol/L 3.2(L) 3.2(L)  Chloride 101 - 111 mmol/L 109 101  CO2 22 - 32 mmol/L 22 25  Calcium 8.9 - 10.3 mg/dL 7.4(L) 8.4(L)  Total Protein 6.5 - 8.1 g/dL 5.2(L) 6.6  Total Bilirubin 0.3 - 1.2 mg/dL 0.7 0.6  Alkaline Phos 38 - 126 U/L 56 72  AST 15 - 41 U/L 14(L) 19  ALT 14 - 54 U/L 14 18    Lipid Panel  No results found for: CHOL, TRIG, HDL, CHOLHDL, VLDL, LDLCALC, LDLDIRECT  CBC    Component Value Date/Time   WBC 12.6 (H) 12/24/2014 0257   RBC 3.46 (L) 12/24/2014 0257   HGB 9.9 (L) 12/24/2014 0257   HCT 30.2 (L) 12/24/2014 0257   PLT 327 12/24/2014 0257   MCV 87.3 12/24/2014 0257   MCH 28.6 12/24/2014 0257   MCHC 32.8 12/24/2014 0257   RDW 14.1 12/24/2014 0257    No results found for: HGBA1C  Assessment & Plan:  1. Neck sprain, initial encounter Advised to apply heat She does have some symptoms of impingement - tiZANidine (ZANAFLEX) 4 MG tablet; Take 1 tablet (4 mg total) by mouth every 8 (eight) hours as needed for muscle spasms.  Dispense: 60 tablet; Refill: 2 - gabapentin (NEURONTIN) 300 MG capsule; Take 1 capsule  (300 mg total) by mouth at bedtime.  Dispense: 30 capsule; Refill: 3  2. Screening for metabolic disorder - LP+Non-HDL Cholesterol - CMP14+EGFR - CBC with Differential/Platelet - Hemoglobin A1c  3. Hyperplasia, fatty tissue Advised weight loss will be beneficial On my exam I do not detect presence of a lipoma  4. Need for hepatitis C screening test - HCV Ab w Reflex to Quant PCR    Meds ordered this encounter  Medications   tiZANidine (ZANAFLEX) 4 MG tablet  Sig: Take 1 tablet (4 mg total) by mouth every 8 (eight) hours as needed for muscle spasms.    Dispense:  60 tablet    Refill:  2   gabapentin (NEURONTIN) 300 MG capsule    Sig: Take 1 capsule (300 mg total) by mouth at bedtime.    Dispense:  30 capsule    Refill:  3    Follow-up: Return in about 6 weeks (around 03/17/2021) for Pap smear.       Charlott Rakes, MD, FAAFP. [facility] and [place], [place]   02/03/2021, 12:24 PM

## 2021-02-03 NOTE — Patient Instructions (Signed)
Radiculopata cervical Cervical Radiculopathy La radiculopata cervical se presenta cuando un nervio del cuello (un nervio cervical) est comprimido o daado. Esta afeccin puede ocurrir debido a una lesin en la columna vertebral cervical (vrtebras) del cuello, o como parte del proceso de envejecimiento normal. La compresin de los nervios cervicales puede causar dolor o adormecimiento que se extiende desde el cuello hasta el brazo y los dedos de la [place]. Esta afeccin generalmente mejora con reposo. Si no mejora, tal vez sea necesario administrar un tratamiento. Cules son las causas? Esta afeccin puede ser causada por lo siguiente: Lesin en el cuello. Un abombamiento (hernia) discal. Espasmos musculares. Rigidez de los msculos del cuello debido al uso excesivo. Artritis. Fractura o degeneracin de los huesos y las articulaciones de la columna (espondiloartrosis) debido al envejecimiento. Espolones seos que pueden formarse cerca de los nervios cervicales. Cules son los signos o sntomas? Los sntomas de esta afeccin incluyen: Engineer, mining. El dolor puede extenderse desde el cuello hasta el brazo y [place]. El dolor puede ser intenso o [place]. Puede empeorar cuando mueve el cuello. Adormecimiento u hormigueo en el brazo o la mano. Debilidad en el brazo y la mano afectados, en casos graves. Cmo se diagnostica? Esta afeccin se puede diagnosticar en funcin de los sntomas, la historia clnica y los antecedentes mdicos. Tambin pueden hacerle estudios, que incluyen los siguientes: Radiografas. Exploracin por tomografa computarizada (TC). Resonancia magntica (RM). Electromiograma (EMG). Pruebas de conduccin nerviosa. Cmo se trata? En muchos casos, no se requiere tratamiento para esta afeccin. Con reposo, esta suele mejorar con [employer]. Si es Publishing rights manager, las opciones pueden incluir lo siguiente: Usar un collarn cervical blando durante perodos  cortos. Hacer fisioterapia para fortalecer los msculos del cuello. Usar medicamentos. Estos pueden incluir antiinflamatorios no esteroideos (AINE), como ibuprofeno, o corticoesteroides orales. Aplicarse inyecciones en la columna vertebral, en los casos graves. Someterse a [place]. Esto puede ser necesario si otros tratamientos no son eficaces. Segn la causa de esta afeccin, podrn implementarse diferentes tipos de [place]. Siga estas indicaciones en su casa: Si tiene un collarn cervical: selo como se lo haya indicado el mdico. Quteselo solamente como se lo haya indicado el mdico. Pregntele al mdico si puede quitarse el collarn cervical para baarse e higienizarse. Si lo autorizan a Warehouse manager para baarse o higienizarse: Siga las instrucciones del mdico acerca de cmo quitarse el collarn de manera segura. Para limpiar el collarn, psele un pao con agua y [place], y squelo bien. Quite las almohadillas desmontables del collarn, si las tiene, cada 1 o 2 [address]  [address] y [address] a mano con agua y [place]. Djelas que se sequen por completo antes de volver a ponerlas en el collarn. Contrlese la piel debajo del collarn para ver si hay irritacin o llagas. Si presenta alguna de estas, informe a su mdico. Control del dolor   Use los medicamentos de venta libre y los recetados solamente como se lo haya indicado el mdico. Si se lo indican, aplique hielo sobre la zona afectada. Para hacer esto: Si tiene un collarn cervical blando, quteselo como se lo haya indicado el mdico. Ponga el hielo en una bolsa plstica. Coloque una toalla entre la piel y Copy. Aplique el hielo durante 20 minutos, 2 o 3 veces por da. Retire el hielo si la piel se pone de color rojo brillante. Esto es [employer]. Si no puede sentir dolor, calor o fro, tiene un mayor riesgo de que se dae la zona. Si aplicarse hielo  no le Research scientist (life sciences), intente Company secretary. Use la fuente de calor que el  mdico le recomiende, como una compresa de calor hmedo o una almohadilla trmica. Coloque una toalla entre la piel y la fuente de Airline pilot. Aplique calor durante 20 a 30 minutos. Retire la fuente de calor si la piel se pone de color rojo brillante. Esto es especialmente importante si no puede sentir dolor, calor o fro. Corre un mayor riesgo de sufrir quemaduras. Intente darse un masaje suave en el cuello y el hombro para ayudar a Paramedic los sntomas. Actividad Descanse todo lo que sea necesario. Retome sus actividades normales como se lo haya indicado el mdico. Pregntele al mdico qu actividades son seguras para usted. Realice ejercicios de elongacin y fortalecimiento como se lo hayan indicado el mdico o el fisioterapeuta. Es posible que Personnel officer objetos. Pregntele al mdico cunto peso puede levantar sin correr [employer]. Indicaciones generales Use una almohada plana para dormir. No conduzca mientras [place] un collarn cervical. Si no tiene un collarn cervical, pregntele al mdico si es seguro que conduzca durante el proceso de curacin del cuello. Pregntele al mdico si el medicamento recetado le impide conducir o usar [place]. No consuma ningn producto que contenga nicotina o tabaco. Estos productos incluyen cigarrillos, tabaco para Theatre manager y aparatos de vapeo, como los Administrator, Civil Service. Si necesita ayuda para dejar de consumir estos productos, consulte al mdico. Concurra a todas las visitas de seguimiento. Esto es importante. Comunquese con un mdico si: La afeccin no mejora con tratamiento. Solicite ayuda de inmediato si: El dolor se intensifica mucho y no se Chief Executive Officer con los medicamentos. Siente debilidad o adormecimiento en la mano, el brazo, el rostro o la pierna. Tiene fiebre alta. Tiene rigidez de cuello. Pierde el control de la vejiga o los intestinos (tiene incontinencia). Tiene dificultad para caminar, mantener el equilibrio o hablar. Resumen La  radiculopata cervical se presenta cuando un nervio del cuello est comprimido o daado. Un nervio puede pinzarse por un abultamiento discal, artritis, espasmos musculares o una lesin en el cuello. Los sntomas Environmental education officer, hormigueo o adormecimiento que se irradia desde el cuello hacia el brazo o la [employer]. En los casos graves, tambin puede presentarse debilidad. El tratamiento puede incluir reposo, fisioterapia y usar un collarn cervical. Pueden recetarle medicamentos para Engineer, materials. En casos graves, tal vez haya que aplicar inyecciones o realizar [place]. Esta informacin no tiene Theme park manager el consejo del mdico. Asegrese de hacerle al mdico cualquier pregunta que tenga. Document Revised: 10/20/2020 Document Reviewed: 10/20/2020 Elsevier Patient Education  2022 [employer].

## 2021-02-04 ENCOUNTER — Telehealth: Payer: Self-pay

## 2021-02-04 LAB — CBC WITH DIFFERENTIAL/PLATELET
Basophils Absolute: 0 10*3/uL (ref 0.0–0.2)
Basos: 0 %
EOS (ABSOLUTE): 0.3 10*3/uL (ref 0.0–0.4)
Eos: 4 %
Hematocrit: 38.7 % (ref 34.0–46.6)
Hemoglobin: 13.4 g/dL (ref 11.1–15.9)
Immature Grans (Abs): 0 10*3/uL (ref 0.0–0.1)
Immature Granulocytes: 0 %
Lymphocytes Absolute: 3.3 10*3/uL — ABNORMAL HIGH (ref 0.7–3.1)
Lymphs: 36 %
MCH: 30.7 pg (ref 26.6–33.0)
MCHC: 34.6 g/dL (ref 31.5–35.7)
MCV: 89 fL (ref 79–97)
Monocytes Absolute: 0.4 10*3/uL (ref 0.1–0.9)
Monocytes: 5 %
Neutrophils Absolute: 5.2 10*3/uL (ref 1.4–7.0)
Neutrophils: 55 %
Platelets: 452 10*3/uL — ABNORMAL HIGH (ref 150–450)
RBC: 4.37 x10E6/uL (ref 3.77–5.28)
RDW: 12.9 % (ref 11.7–15.4)
WBC: 9.3 10*3/uL (ref 3.4–10.8)

## 2021-02-04 LAB — CMP14+EGFR
ALT: 15 IU/L (ref 0–32)
AST: 13 IU/L (ref 0–40)
Albumin/Globulin Ratio: 1.8 (ref 1.2–2.2)
Albumin: 4.6 g/dL (ref 3.8–4.8)
Alkaline Phosphatase: 102 IU/L (ref 44–121)
BUN/Creatinine Ratio: 13 (ref 9–23)
BUN: 10 mg/dL (ref 6–20)
Bilirubin Total: 0.3 mg/dL (ref 0.0–1.2)
CO2: 23 mmol/L (ref 20–29)
Calcium: 9.7 mg/dL (ref 8.7–10.2)
Chloride: 103 mmol/L (ref 96–106)
Creatinine, Ser: 0.75 mg/dL (ref 0.57–1.00)
Globulin, Total: 2.5 g/dL (ref 1.5–4.5)
Glucose: 109 mg/dL — ABNORMAL HIGH (ref 70–99)
Potassium: 5.2 mmol/L (ref 3.5–5.2)
Sodium: 140 mmol/L (ref 134–144)
Total Protein: 7.1 g/dL (ref 6.0–8.5)
eGFR: 104 mL/min/{1.73_m2} (ref >59)

## 2021-02-04 LAB — LP+NON-HDL CHOLESTEROL
Cholesterol, Total: 178 mg/dL (ref 100–199)
HDL: 59 mg/dL (ref >39)
LDL Chol Calc (NIH): 92 mg/dL (ref 0–99)
Total Non-HDL-Chol (LDL+VLDL): 119 mg/dL (ref 0–129)
Triglycerides: 154 mg/dL — ABNORMAL HIGH (ref 0–149)
VLDL Cholesterol Cal: 27 mg/dL (ref 5–40)

## 2021-02-04 LAB — HEMOGLOBIN A1C
Est. average glucose Bld gHb Est-mCnc: 114 mg/dL
Hgb A1c MFr Bld: 5.6 % (ref 4.8–5.6)

## 2021-02-04 LAB — HCV INTERPRETATION

## 2021-02-04 LAB — HCV AB W REFLEX TO QUANT PCR: HCV Ab: 0.2 s/co ratio (ref 0.0–0.9)

## 2021-02-04 NOTE — Telephone Encounter (Signed)
Pt was called and informed of lab results via VM. 

## 2021-02-04 NOTE — Telephone Encounter (Signed)
-----   Message from Hoy Register, MD sent at 02/04/2021  9:26 AM EST ----- Please inform the patient that labs are normal. Thank you.

## 2021-03-17 ENCOUNTER — Ambulatory Visit: Payer: Self-pay | Admitting: Family Medicine

## 2021-05-04 ENCOUNTER — Encounter (HOSPITAL_COMMUNITY): Payer: Self-pay | Admitting: Emergency Medicine

## 2021-05-04 ENCOUNTER — Ambulatory Visit (HOSPITAL_COMMUNITY)
Admission: EM | Admit: 2021-05-04 | Discharge: 2021-05-04 | Disposition: A | Discharge status: Home / Self Care | Payer: Self-pay | Attending: Family Medicine | Admitting: Family Medicine

## 2021-05-04 ENCOUNTER — Other Ambulatory Visit: Payer: Self-pay

## 2021-05-04 DIAGNOSIS — M25511 Pain in right shoulder: Secondary | ICD-10-CM

## 2021-05-04 MED ORDER — PREDNISONE 10 MG PO TABS: ORAL_TABLET | ORAL | 0 refills | Status: AC

## 2021-05-04 NOTE — ED Triage Notes (Signed)
Pt c/o right arm pain that has gotten worse over the past 3 days. Was seen here before for arm pain and given muscle relaxer but not helping any more. Denies falls, or injury.

## 2021-05-04 NOTE — ED Provider Notes (Signed)
[facility]    CSN: [id number] Arrival date & time: 05/04/21  4656      History   Chief Complaint Chief Complaint  Patient presents with   Arm Pain    HPI [name] [name] is a 41 y.o. female.   Right Arm Pain Initially having pain in November Denies any initial injury States that she is right-hand dominant and does use her right hand a lot while she is chopping things at work She has been seen by PCP for this and was given muscle relaxer and gabapentin She states that neither of these medications seem to help States that the pain is gotten severe in the last 2 days States that she was using her arm a lot on Sunday and thinks that this may have aggravated the pain The pain is mostly in the lateral aspect of her right shoulder and radiates down to her elbow She reports severely limited range of motion Denies numbness and tingling Denies changes in pain with neck movement Pain is mostly worse with moving her arm Hurts to sleep on the side She has been taking Tylenol with minimal improvement   Past Medical History:  Diagnosis Date   Depression    pp depression   Frequent UTI    GERD (gastroesophageal reflux disease)    History of genital warts     Patient Active Problem List   Diagnosis Date Noted   Appendicitis 12/22/2014   Acute appendicitis 12/22/2014   Post-dates pregnancy 03/16/2014    Past Surgical History:  Procedure Laterality Date   CESAREAN SECTION N/A 03/17/2014   Procedure: CESAREAN SECTION;  Surgeon: Levie Heritage, DO;  Location: [facility];  Service: Obstetrics;  Laterality: N/A;   CESAREAN SECTION     LAPAROSCOPIC APPENDECTOMY N/A 12/23/2014   Procedure: APPENDECTOMY LAPAROSCOPIC;  Surgeon: Jimmye Norman, MD;  Location: [facility];  Service: General;  Laterality: N/A;    OB History     Gravida  3   Para  3   Term  3   Preterm  0   AB  0   Living  3      SAB  0   IAB  0   Ectopic  0   Multiple      Live Births   3            Home Medications    Prior to Admission medications   Medication Sig Start Date End Date Taking? Authorizing Provider  predniSONE (DELTASONE) 10 MG tablet Take 6 tablets (60 mg total) by mouth daily for 1 day, THEN 5 tablets (50 mg total) daily for 1 day, THEN 4 tablets (40 mg total) daily for 1 day, THEN 3 tablets (30 mg total) daily for 1 day, THEN 2 tablets (20 mg total) daily for 1 day, THEN 1 tablet (10 mg total) daily for 1 day. 05/04/21 05/10/21 Yes Bacilio Abascal, Solmon Ice, DO  acetaminophen (TYLENOL) 325 MG tablet Take 650 mg by mouth every 6 (six) hours as needed.    [provider]  gabapentin (NEURONTIN) 300 MG capsule Take 1 capsule (300 mg total) by mouth at bedtime. 02/03/21   Hoy Register, MD  HYDROcodone-acetaminophen (NORCO/VICODIN) 5-325 MG tablet Take 1-2 tablets by mouth every 4 (four) hours as needed for moderate pain. 12/24/14   Riebock, Anette Riedel, NP  lactulose (CHRONULAC) 10 GM/15ML solution Take 30 mLs (20 g total) by mouth 2 (two) times daily as needed for mild constipation or moderate constipation. Patient  not taking: Reported on 02/03/2021 03/20/14   Fredirick Lathe, MD  oxyCODONE-acetaminophen (PERCOCET/ROXICET) 5-325 MG per tablet Take 1 tablet by mouth every 4 (four) hours as needed (for pain scale less than 7). Patient not taking: Reported on 02/03/2021 03/20/14   Fredirick Lathe, MD  Prenatal Vit-Fe Fumarate-FA (PRENATAL MULTIVITAMIN) TABS tablet Take 1 tablet by mouth daily at 12 noon. Patient not taking: Reported on 02/03/2021    [provider]  tiZANidine (ZANAFLEX) 4 MG tablet Take 1 tablet (4 mg total) by mouth every 8 (eight) hours as needed for muscle spasms. 02/03/21   Hoy Register, MD    Family History Family History  Problem Relation Age of Onset   Hypertension Mother    Diabetes Mother    Urinary tract infection Mother    Urinary tract infection Sister    Heart murmur Son     Social History Social History    Tobacco Use   Smoking status: Never  Substance Use Topics   Alcohol use: No   Drug use: No     Allergies   Patient has no known allergies.   Review of Systems Review of Systems  All other systems reviewed and are negative. Per HPI  Physical Exam Triage Vital Signs ED Triage Vitals  Enc Vitals Group     BP      Pulse      Resp      Temp      Temp src      SpO2      Weight      Height      Head Circumference      Peak Flow      Pain Score      Pain Loc      Pain Edu?      Excl. in GC?    No data found.  Updated Vital Signs BP 128/77 (BP Location: Left Arm)    Pulse 78    Temp 99.4 F (37.4 C) (Oral)    Resp 18    LMP 04/27/2021    SpO2 99%   Visual Acuity Right Eye Distance:   Left Eye Distance:   Bilateral Distance:    Right Eye Near:   Left Eye Near:    Bilateral Near:     Physical Exam Constitutional:      General: She is not in acute distress.    Appearance: Normal appearance. She is not ill-appearing.  HENT:     Head: Normocephalic and atraumatic.  Eyes:     Conjunctiva/sclera: Conjunctivae normal.  Cardiovascular:     Rate and Rhythm: Normal rate.  Pulmonary:     Effort: Pulmonary effort is normal. No respiratory distress.  Musculoskeletal:     Cervical back: Normal range of motion.     Comments: Right  shoulder: Inspection reveals no obvious deformity, atrophy, or asymmetry b/l. No bruising. No swelling She has TTP on the lateral aspect of her shoulder in the region of the subacromial bursa and hypertonicity and mild tenderness to palpation throughout her upper trapezius Her range of motion is severely limited in all fields both actively and passively, she is tearful and very apprehensive with range of motion Passively she has external rotation to 5 degrees, abduction to 20 degrees, forward flexion to 20 degrees on right, full range of motion on left NV intact distally b/l Special Tests:  - Impingement: Positive Hawkins at waistline -  Supraspinatous: Positive empty can at waistline - Infraspinatous/Teres Minor: 5/5 strength with  ER - Subscapularis: 4/5 strength with IR with pain - Biceps tendon: Testing deferred secondary to pain - Labrum: Testing deferred secondary to pain    Skin:    General: Skin is warm and dry.  Neurological:     Mental Status: She is alert and oriented to person, place, and time.     Sensory: No sensory deficit.  Psychiatric:        Mood and Affect: Mood normal.        Behavior: Behavior normal.     UC Treatments / Results  Labs (all labs ordered are listed, but only abnormal results are displayed) Labs Reviewed - No data to display  EKG   Radiology No results found.  Procedures Procedures (including critical care time)  Medications Ordered in UC Medications - No data to display  Initial Impression / Assessment and Plan / UC Course  I have reviewed the triage vital signs and the nursing notes.  Pertinent labs & imaging results that were available during my care of the patient were reviewed by me and considered in my medical decision making (see chart for details).     Her symptoms are most consistent with severe rotator cuff pathology, concern for a chronic tear.  Given her rather significant symptoms, will treat with prednisone 6-day taper and have her follow-up with sports medicine next week.  We will also write her off of work until next week so that she has time to heal given the severity of her symptoms.  Advised to not use NSAIDs while taking prednisone but can continue to use Tylenol.  Also recommended icing.   Final Clinical Impressions(s) / UC Diagnoses   Final diagnoses:  Acute pain of right shoulder   Discharge Instructions   None    ED Prescriptions     Medication Sig Dispense Auth. Provider   predniSONE (DELTASONE) 10 MG tablet Take 6 tablets (60 mg total) by mouth daily for 1 day, THEN 5 tablets (50 mg total) daily for 1 day, THEN 4 tablets (40 mg total)  daily for 1 day, THEN 3 tablets (30 mg total) daily for 1 day, THEN 2 tablets (20 mg total) daily for 1 day, THEN 1 tablet (10 mg total) daily for 1 day. 21 tablet Ariane Ditullio, Solmon Ice, DO      PDMP not reviewed this encounter.   Shellby Schlink, Solmon Ice, DO 05/04/21 [phone]

## 2021-05-13 ENCOUNTER — Ambulatory Visit: Payer: Self-pay

## 2021-05-13 ENCOUNTER — Ambulatory Visit (INDEPENDENT_AMBULATORY_CARE_PROVIDER_SITE_OTHER): Payer: Self-pay | Admitting: Family Medicine

## 2021-05-13 ENCOUNTER — Encounter: Payer: Self-pay | Admitting: Family Medicine

## 2021-05-13 VITALS — BP 124/84 | Ht 60.0 in | Wt 175.0 lb

## 2021-05-13 DIAGNOSIS — M25521 Pain in right elbow: Secondary | ICD-10-CM

## 2021-05-13 NOTE — Progress Notes (Signed)
PCP: Patient, No Pcp Per (Inactive)  Subjective:   HPI: Patient is a 41 y.o. female here for right arm pain. It started in the shoulder and elbow but is now located in the upper arm. The arm pain began Sunday but she was seen in UC 2/8 for shoulder pain. She has finished her course of prednisone given to her at the UC. She does a lot of chopping/cutting at work. The table that she uses come up to her chest. She admits to cutting in a way that her elbow is raised. She denies elbow and shoulder pain today. She desires an ultrasound for confirmation of some diagnosis.   *In-person Spanish interpreter used during entire encounter  Past Medical History:  Diagnosis Date   Depression    pp depression   Frequent UTI    GERD (gastroesophageal reflux disease)    History of genital warts     Current Outpatient Medications on File Prior to Visit  Medication Sig Dispense Refill   acetaminophen (TYLENOL) 325 MG tablet Take 650 mg by mouth every 6 (six) hours as needed.     gabapentin (NEURONTIN) 300 MG capsule Take 1 capsule (300 mg total) by mouth at bedtime. 30 capsule 3   HYDROcodone-acetaminophen (NORCO/VICODIN) 5-325 MG tablet Take 1-2 tablets by mouth every 4 (four) hours as needed for moderate pain. 40 tablet 0   lactulose (CHRONULAC) 10 GM/15ML solution Take 30 mLs (20 g total) by mouth 2 (two) times daily as needed for mild constipation or moderate constipation. (Patient not taking: Reported on 02/03/2021) 240 mL 3   oxyCODONE-acetaminophen (PERCOCET/ROXICET) 5-325 MG per tablet Take 1 tablet by mouth every 4 (four) hours as needed (for pain scale less than 7). (Patient not taking: Reported on 02/03/2021) 50 tablet 0   Prenatal Vit-Fe Fumarate-FA (PRENATAL MULTIVITAMIN) TABS tablet Take 1 tablet by mouth daily at 12 noon. (Patient not taking: Reported on 02/03/2021)     tiZANidine (ZANAFLEX) 4 MG tablet Take 1 tablet (4 mg total) by mouth every 8 (eight) hours as needed for muscle spasms. 60  tablet 2   No current facility-administered medications on file prior to visit.    Past Surgical History:  Procedure Laterality Date   CESAREAN SECTION N/A 03/17/2014   Procedure: CESAREAN SECTION;  Surgeon: Levie Heritage, DO;  Location: [facility];  Service: Obstetrics;  Laterality: N/A;   CESAREAN SECTION     LAPAROSCOPIC APPENDECTOMY N/A 12/23/2014   Procedure: APPENDECTOMY LAPAROSCOPIC;  Surgeon: Jimmye Norman, MD;  Location: [facility];  Service: General;  Laterality: N/A;    No Known Allergies  BP 124/84    Ht 5' (1.524 m)    Wt 175 lb (79.4 kg)    LMP 04/27/2021    BMI 34.18 kg/m   No flowsheet data found.  No flowsheet data found.      Objective:  Physical Exam:  Gen: NAD, comfortable in exam room  Right Shoulder: Inspection reveals no obvious deformity, atrophy, or asymmetry b/l. No bruising. No swelling Palpation is normal with no TTP over [facility] joint or bicipital groove b/l. Tender to palpation over biceps. Full ROM in flexion, abduction, internal/external rotation b/l NV intact distally b/l Normal scapular function observed b/l Special Tests:  - Impingement: Neg Hawkins, neers, empty can sign. - Supraspinatous: Negative empty can - Infraspinatous/Teres Minor: 5/5 strength with ER - Subscapularis: 5/5 strength with IR - Biceps tendon: Negative Speeds, Yerrgason's  - No painful arc and no drop arm sign  ULTRASOUND: Shoulder, Right  Diagnostic limited ultrasound imaging obtained of patient's right shoulder.  - No obvious evidence of bony deformity or osteophyte development appreciated.  - Long head of the biceps tendon: No evidence of tendon thickening, calcification, subluxation, or tearing in short or long axis views. No edema or bullseye sign.  - Pec major insertion visualized without abnormality. - Subscapularis tendon: complete visualization across the width of the insertion point yielded a bone spur located at the superior endplate - Supraspinatus tendon: complete  visualization across the width of the insertion point yielded no evidence of tendon thickening, calcification, or tears in the long axis view. No evidence of bursal inflammation appreciated.  - Infraspinatus and teres minor tendons: visualization across the width of the insertion points yielded no evidence of tendon thickening, calcification, or tears in the long axis view.  [facility] Joint: No evidence of joint separation, collapse, or osteophyte development appreciated. No effusion present.  - Posterior Glenohumeral Joint visualized without abnormality. IMPRESSION: findings consistent with a bone spur located at the superior end plate of the subscapularis.  Abnormalities found, note neovessels:  "  Doppler showed no evidence of neovascularization  "      Assessment & Plan:  1. Arthralgia of right upper arm Hx of acute shoulder pain 2/8. Continues to have arm pain over the biceps. Exam is unremarkable except for some mild tenderness over biceps muscles. US obtained today showed some bone spurring at the superior endplate at the subscapularis which could be the source of her discomfort. She does a lot of cutting/chopping food at work on a high table. We discussed changing the angle at which she it cutting by asking for a stool at work. Letter given for accommodations. Encouraged to follow up if this fails to resolve symptoms.  - [place] LIMITED JOINT SPACE STRUCTURES UP RIGHT; Future

## 2021-05-13 NOTE — Patient Instructions (Addendum)
Thank you for coming in today. As discussed you will need a stool at work to decrease the angle of cutting. We have given you a letter for the accomodation. Your ultrasound did not have any concerning findings at this time. If this continues to be an issue despite these accommodations, please return to care.   Gracias por venir hoy. Como se mencion, necesitar un taburete en el trabajo para disminuir el ngulo de corte. Le hemos dado una carta para el alojamiento. 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 Si esto contina siendo un problema a pesar de estas adaptaciones, vuelva a recibir atencin.

## 2021-05-16 NOTE — Progress Notes (Signed)
[facility] Attending Note: I have seen and examined this patient. I have discussed this patient with the resident and reviewed the assessment and plan as documented above. I agree with the resident's findings and plan. Overuse injury right upper arm, likely related to improper work station height for her. She is short and needs assistive device such as stool. Letter given.  The prednisone taper she took has helped significantly with her pain. Hopefully modification of her work station will help prevent recurrence.

## 2023-08-28 ENCOUNTER — Other Ambulatory Visit: Payer: Self-pay | Admitting: Obstetrics & Gynecology

## 2023-08-28 DIAGNOSIS — Z1231 Encounter for screening mammogram for malignant neoplasm of breast: Secondary | ICD-10-CM
